# Patient Record
Sex: Female | Born: 1970 | Race: White | Hispanic: No | Marital: Married | Smoking: Never smoker
Health system: Southern US, Community
[De-identification: ages and names within clinical notes are randomized; demographics above are authoritative.]

## PROBLEM LIST (undated history)

## (undated) DIAGNOSIS — G709 Myoneural disorder, unspecified: Secondary | ICD-10-CM

## (undated) DIAGNOSIS — R112 Nausea with vomiting, unspecified: Secondary | ICD-10-CM

## (undated) DIAGNOSIS — R7303 Prediabetes: Secondary | ICD-10-CM

## (undated) DIAGNOSIS — S92902A Unspecified fracture of left foot, initial encounter for closed fracture: Secondary | ICD-10-CM

## (undated) DIAGNOSIS — F32A Depression, unspecified: Secondary | ICD-10-CM

## (undated) DIAGNOSIS — G7000 Myasthenia gravis without (acute) exacerbation: Secondary | ICD-10-CM

## (undated) DIAGNOSIS — R51 Headache: Secondary | ICD-10-CM

## (undated) DIAGNOSIS — G4730 Sleep apnea, unspecified: Secondary | ICD-10-CM

## (undated) DIAGNOSIS — I10 Essential (primary) hypertension: Secondary | ICD-10-CM

## (undated) DIAGNOSIS — J301 Allergic rhinitis due to pollen: Secondary | ICD-10-CM

## (undated) DIAGNOSIS — R002 Palpitations: Secondary | ICD-10-CM

## (undated) DIAGNOSIS — Z9889 Other specified postprocedural states: Secondary | ICD-10-CM

## (undated) DIAGNOSIS — F329 Major depressive disorder, single episode, unspecified: Secondary | ICD-10-CM

## (undated) HISTORY — PX: DIAGNOSTIC LAPAROSCOPY: SUR761

## (undated) HISTORY — DX: Myoneural disorder, unspecified: G70.9

## (undated) HISTORY — PX: LAPAROSCOPIC VAGINAL HYSTERECTOMY: SUR798

## (undated) HISTORY — PX: WISDOM TOOTH EXTRACTION: SHX21

## (undated) HISTORY — PX: COLONOSCOPY: SHX174

## (undated) HISTORY — PX: OTHER SURGICAL HISTORY: SHX169

---

## 1998-10-03 ENCOUNTER — Other Ambulatory Visit: Admission: RE | Admit: 1998-10-03 | Discharge: 1998-10-03 | Payer: Self-pay | Admitting: *Deleted

## 1999-06-01 ENCOUNTER — Encounter: Payer: Self-pay | Admitting: Family Medicine

## 1999-06-01 ENCOUNTER — Ambulatory Visit (HOSPITAL_COMMUNITY): Admission: RE | Admit: 1999-06-01 | Discharge: 1999-06-01 | Payer: Self-pay | Admitting: Family Medicine

## 2002-09-24 ENCOUNTER — Encounter: Payer: Self-pay | Admitting: Emergency Medicine

## 2002-09-24 ENCOUNTER — Emergency Department (HOSPITAL_COMMUNITY): Admission: EM | Admit: 2002-09-24 | Discharge: 2002-09-24 | Payer: Self-pay | Admitting: Emergency Medicine

## 2003-04-08 ENCOUNTER — Other Ambulatory Visit: Admission: RE | Admit: 2003-04-08 | Discharge: 2003-04-08 | Payer: Self-pay | Admitting: *Deleted

## 2004-07-13 ENCOUNTER — Other Ambulatory Visit: Admission: RE | Admit: 2004-07-13 | Discharge: 2004-07-13 | Payer: Self-pay | Admitting: Obstetrics and Gynecology

## 2005-08-22 ENCOUNTER — Other Ambulatory Visit: Admission: RE | Admit: 2005-08-22 | Discharge: 2005-08-22 | Payer: Self-pay | Admitting: Obstetrics and Gynecology

## 2007-12-30 ENCOUNTER — Encounter: Admission: RE | Admit: 2007-12-30 | Discharge: 2007-12-30 | Payer: Self-pay | Admitting: Obstetrics and Gynecology

## 2008-01-01 ENCOUNTER — Encounter: Admission: RE | Admit: 2008-01-01 | Discharge: 2008-01-01 | Payer: Self-pay | Admitting: Obstetrics and Gynecology

## 2008-06-28 ENCOUNTER — Encounter: Admission: RE | Admit: 2008-06-28 | Discharge: 2008-06-28 | Payer: Self-pay | Admitting: Obstetrics and Gynecology

## 2010-09-30 ENCOUNTER — Encounter: Payer: Self-pay | Admitting: Cardiovascular Disease

## 2010-11-22 ENCOUNTER — Other Ambulatory Visit: Payer: Self-pay | Admitting: Obstetrics and Gynecology

## 2010-11-22 DIAGNOSIS — Z1231 Encounter for screening mammogram for malignant neoplasm of breast: Secondary | ICD-10-CM

## 2010-12-01 ENCOUNTER — Ambulatory Visit
Admission: RE | Admit: 2010-12-01 | Discharge: 2010-12-01 | Disposition: A | Discharge status: Home / Self Care | Payer: BC Managed Care – PPO | Source: Ambulatory Visit | Attending: Obstetrics and Gynecology | Admitting: Obstetrics and Gynecology

## 2010-12-01 DIAGNOSIS — Z1231 Encounter for screening mammogram for malignant neoplasm of breast: Secondary | ICD-10-CM

## 2012-01-11 ENCOUNTER — Other Ambulatory Visit: Payer: Self-pay | Admitting: Obstetrics and Gynecology

## 2012-01-11 DIAGNOSIS — Z1231 Encounter for screening mammogram for malignant neoplasm of breast: Secondary | ICD-10-CM

## 2012-01-17 ENCOUNTER — Ambulatory Visit: Payer: BC Managed Care – PPO

## 2012-04-23 ENCOUNTER — Ambulatory Visit
Admission: RE | Admit: 2012-04-23 | Discharge: 2012-04-23 | Disposition: A | Discharge status: Home / Self Care | Payer: BC Managed Care – PPO | Source: Ambulatory Visit | Attending: Obstetrics and Gynecology | Admitting: Obstetrics and Gynecology

## 2012-04-23 DIAGNOSIS — Z1231 Encounter for screening mammogram for malignant neoplasm of breast: Secondary | ICD-10-CM

## 2012-06-23 ENCOUNTER — Other Ambulatory Visit (HOSPITAL_COMMUNITY): Payer: Self-pay | Admitting: Family Medicine

## 2012-06-23 ENCOUNTER — Ambulatory Visit (HOSPITAL_COMMUNITY)
Admission: RE | Admit: 2012-06-23 | Discharge: 2012-06-23 | Disposition: A | Discharge status: Home / Self Care | Payer: BC Managed Care – PPO | Source: Ambulatory Visit | Attending: Family Medicine | Admitting: Family Medicine

## 2012-06-23 DIAGNOSIS — R079 Chest pain, unspecified: Secondary | ICD-10-CM

## 2013-01-22 ENCOUNTER — Other Ambulatory Visit (HOSPITAL_COMMUNITY): Payer: Self-pay | Admitting: Obstetrics and Gynecology

## 2013-01-22 DIAGNOSIS — E041 Nontoxic single thyroid nodule: Secondary | ICD-10-CM

## 2013-01-26 ENCOUNTER — Ambulatory Visit (HOSPITAL_COMMUNITY)
Admission: RE | Admit: 2013-01-26 | Discharge: 2013-01-26 | Disposition: A | Discharge status: Home / Self Care | Payer: BC Managed Care – PPO | Source: Ambulatory Visit | Attending: Obstetrics and Gynecology | Admitting: Obstetrics and Gynecology

## 2013-01-26 DIAGNOSIS — E041 Nontoxic single thyroid nodule: Secondary | ICD-10-CM | POA: Insufficient documentation

## 2013-05-18 ENCOUNTER — Other Ambulatory Visit: Payer: Self-pay

## 2013-05-18 DIAGNOSIS — Z1231 Encounter for screening mammogram for malignant neoplasm of breast: Secondary | ICD-10-CM

## 2013-05-20 ENCOUNTER — Ambulatory Visit
Admission: RE | Admit: 2013-05-20 | Discharge: 2013-05-20 | Disposition: A | Discharge status: Home / Self Care | Payer: BC Managed Care – PPO | Source: Ambulatory Visit

## 2013-05-20 DIAGNOSIS — Z1231 Encounter for screening mammogram for malignant neoplasm of breast: Secondary | ICD-10-CM

## 2013-08-21 ENCOUNTER — Other Ambulatory Visit: Payer: Self-pay | Admitting: Family Medicine

## 2013-08-21 ENCOUNTER — Ambulatory Visit
Admission: RE | Admit: 2013-08-21 | Discharge: 2013-08-21 | Disposition: A | Discharge status: Home / Self Care | Payer: BC Managed Care – PPO | Source: Ambulatory Visit | Attending: Family Medicine | Admitting: Family Medicine

## 2013-08-21 DIAGNOSIS — M7989 Other specified soft tissue disorders: Secondary | ICD-10-CM

## 2013-08-24 ENCOUNTER — Other Ambulatory Visit: Payer: Self-pay | Admitting: Family Medicine

## 2013-08-24 DIAGNOSIS — M7989 Other specified soft tissue disorders: Secondary | ICD-10-CM

## 2013-08-27 ENCOUNTER — Ambulatory Visit
Admission: RE | Admit: 2013-08-27 | Discharge: 2013-08-27 | Disposition: A | Discharge status: Home / Self Care | Payer: BC Managed Care – PPO | Source: Ambulatory Visit | Attending: Family Medicine | Admitting: Family Medicine

## 2013-08-27 DIAGNOSIS — M7989 Other specified soft tissue disorders: Secondary | ICD-10-CM

## 2013-08-27 HISTORY — DX: Major depressive disorder, single episode, unspecified: F32.9

## 2013-08-27 HISTORY — DX: Allergic rhinitis due to pollen: J30.1

## 2013-08-27 HISTORY — DX: Essential (primary) hypertension: I10

## 2013-08-27 HISTORY — DX: Unspecified fracture of left foot, initial encounter for closed fracture: S92.902A

## 2013-08-27 HISTORY — DX: Headache: R51

## 2013-08-27 HISTORY — DX: Depression, unspecified: F32.A

## 2014-04-26 ENCOUNTER — Other Ambulatory Visit: Payer: Self-pay

## 2014-04-26 DIAGNOSIS — Z1231 Encounter for screening mammogram for malignant neoplasm of breast: Secondary | ICD-10-CM

## 2014-06-02 ENCOUNTER — Ambulatory Visit
Admission: RE | Admit: 2014-06-02 | Discharge: 2014-06-02 | Disposition: A | Discharge status: Home / Self Care | Payer: BC Managed Care – PPO | Source: Ambulatory Visit

## 2014-06-02 ENCOUNTER — Encounter (INDEPENDENT_AMBULATORY_CARE_PROVIDER_SITE_OTHER): Payer: Self-pay

## 2014-06-02 DIAGNOSIS — Z1231 Encounter for screening mammogram for malignant neoplasm of breast: Secondary | ICD-10-CM

## 2015-02-17 ENCOUNTER — Other Ambulatory Visit (HOSPITAL_COMMUNITY): Payer: Self-pay | Admitting: Obstetrics and Gynecology

## 2015-02-17 DIAGNOSIS — E041 Nontoxic single thyroid nodule: Secondary | ICD-10-CM

## 2015-02-24 ENCOUNTER — Ambulatory Visit (HOSPITAL_COMMUNITY): Payer: BLUE CROSS/BLUE SHIELD

## 2015-03-01 ENCOUNTER — Ambulatory Visit (HOSPITAL_COMMUNITY)
Admission: RE | Admit: 2015-03-01 | Discharge: 2015-03-01 | Disposition: A | Discharge status: Home / Self Care | Payer: BLUE CROSS/BLUE SHIELD | Source: Ambulatory Visit | Attending: Obstetrics and Gynecology | Admitting: Obstetrics and Gynecology

## 2015-03-01 DIAGNOSIS — E041 Nontoxic single thyroid nodule: Secondary | ICD-10-CM | POA: Insufficient documentation

## 2015-05-03 ENCOUNTER — Other Ambulatory Visit: Payer: Self-pay

## 2015-05-03 DIAGNOSIS — Z1231 Encounter for screening mammogram for malignant neoplasm of breast: Secondary | ICD-10-CM

## 2015-06-06 ENCOUNTER — Ambulatory Visit
Admission: RE | Admit: 2015-06-06 | Discharge: 2015-06-06 | Disposition: A | Discharge status: Home / Self Care | Payer: BLUE CROSS/BLUE SHIELD | Source: Ambulatory Visit

## 2015-06-06 DIAGNOSIS — Z1231 Encounter for screening mammogram for malignant neoplasm of breast: Secondary | ICD-10-CM

## 2015-10-28 ENCOUNTER — Other Ambulatory Visit: Payer: Self-pay | Admitting: Otolaryngology

## 2015-10-28 DIAGNOSIS — M542 Cervicalgia: Secondary | ICD-10-CM

## 2015-10-28 DIAGNOSIS — R221 Localized swelling, mass and lump, neck: Secondary | ICD-10-CM

## 2015-11-01 ENCOUNTER — Other Ambulatory Visit (HOSPITAL_COMMUNITY): Payer: Self-pay | Admitting: Family Medicine

## 2015-11-01 DIAGNOSIS — R011 Cardiac murmur, unspecified: Secondary | ICD-10-CM

## 2015-11-04 ENCOUNTER — Ambulatory Visit
Admission: RE | Admit: 2015-11-04 | Discharge: 2015-11-04 | Disposition: A | Discharge status: Home / Self Care | Payer: BLUE CROSS/BLUE SHIELD | Source: Ambulatory Visit | Attending: Otolaryngology | Admitting: Otolaryngology

## 2015-11-04 DIAGNOSIS — M542 Cervicalgia: Secondary | ICD-10-CM

## 2015-11-04 DIAGNOSIS — R221 Localized swelling, mass and lump, neck: Secondary | ICD-10-CM

## 2015-11-08 ENCOUNTER — Other Ambulatory Visit (HOSPITAL_COMMUNITY): Payer: BLUE CROSS/BLUE SHIELD

## 2015-11-16 ENCOUNTER — Other Ambulatory Visit: Payer: Self-pay

## 2015-11-16 ENCOUNTER — Ambulatory Visit (HOSPITAL_COMMUNITY): Payer: BLUE CROSS/BLUE SHIELD | Attending: Family Medicine

## 2015-11-16 DIAGNOSIS — I340 Nonrheumatic mitral (valve) insufficiency: Secondary | ICD-10-CM | POA: Diagnosis not present

## 2015-11-16 DIAGNOSIS — I10 Essential (primary) hypertension: Secondary | ICD-10-CM | POA: Diagnosis not present

## 2015-11-16 DIAGNOSIS — Z6837 Body mass index (BMI) 37.0-37.9, adult: Secondary | ICD-10-CM | POA: Insufficient documentation

## 2015-11-16 DIAGNOSIS — E669 Obesity, unspecified: Secondary | ICD-10-CM | POA: Insufficient documentation

## 2015-11-16 DIAGNOSIS — R011 Cardiac murmur, unspecified: Secondary | ICD-10-CM | POA: Insufficient documentation

## 2016-02-22 DIAGNOSIS — Z01419 Encounter for gynecological examination (general) (routine) without abnormal findings: Secondary | ICD-10-CM | POA: Diagnosis not present

## 2016-02-22 DIAGNOSIS — Z6838 Body mass index (BMI) 38.0-38.9, adult: Secondary | ICD-10-CM | POA: Diagnosis not present

## 2016-02-29 DIAGNOSIS — R600 Localized edema: Secondary | ICD-10-CM | POA: Diagnosis not present

## 2016-02-29 DIAGNOSIS — R21 Rash and other nonspecific skin eruption: Secondary | ICD-10-CM | POA: Diagnosis not present

## 2016-02-29 DIAGNOSIS — I10 Essential (primary) hypertension: Secondary | ICD-10-CM | POA: Diagnosis not present

## 2016-03-26 DIAGNOSIS — I10 Essential (primary) hypertension: Secondary | ICD-10-CM | POA: Diagnosis not present

## 2016-03-26 DIAGNOSIS — F419 Anxiety disorder, unspecified: Secondary | ICD-10-CM | POA: Diagnosis not present

## 2016-04-25 DIAGNOSIS — M791 Myalgia: Secondary | ICD-10-CM | POA: Diagnosis not present

## 2016-04-25 DIAGNOSIS — H02403 Unspecified ptosis of bilateral eyelids: Secondary | ICD-10-CM | POA: Diagnosis not present

## 2016-04-25 DIAGNOSIS — R5383 Other fatigue: Secondary | ICD-10-CM | POA: Diagnosis not present

## 2016-05-18 ENCOUNTER — Other Ambulatory Visit: Payer: Self-pay | Admitting: Obstetrics and Gynecology

## 2016-05-18 DIAGNOSIS — Z1231 Encounter for screening mammogram for malignant neoplasm of breast: Secondary | ICD-10-CM

## 2016-06-05 DIAGNOSIS — M2550 Pain in unspecified joint: Secondary | ICD-10-CM | POA: Diagnosis not present

## 2016-06-05 DIAGNOSIS — R5382 Chronic fatigue, unspecified: Secondary | ICD-10-CM | POA: Diagnosis not present

## 2016-06-05 DIAGNOSIS — M791 Myalgia: Secondary | ICD-10-CM | POA: Diagnosis not present

## 2016-06-07 ENCOUNTER — Ambulatory Visit: Payer: BLUE CROSS/BLUE SHIELD

## 2016-06-11 ENCOUNTER — Ambulatory Visit
Admission: RE | Admit: 2016-06-11 | Discharge: 2016-06-11 | Disposition: A | Discharge status: Home / Self Care | Payer: BLUE CROSS/BLUE SHIELD | Source: Ambulatory Visit | Attending: Obstetrics and Gynecology | Admitting: Obstetrics and Gynecology

## 2016-06-11 DIAGNOSIS — Z1231 Encounter for screening mammogram for malignant neoplasm of breast: Secondary | ICD-10-CM | POA: Diagnosis not present

## 2016-06-18 ENCOUNTER — Other Ambulatory Visit: Payer: Self-pay | Admitting: Obstetrics and Gynecology

## 2016-06-18 DIAGNOSIS — Z803 Family history of malignant neoplasm of breast: Secondary | ICD-10-CM

## 2016-07-18 ENCOUNTER — Ambulatory Visit
Admission: RE | Admit: 2016-07-18 | Discharge: 2016-07-18 | Disposition: A | Discharge status: Home / Self Care | Payer: BLUE CROSS/BLUE SHIELD | Source: Ambulatory Visit | Attending: Obstetrics and Gynecology | Admitting: Obstetrics and Gynecology

## 2016-07-18 DIAGNOSIS — Z803 Family history of malignant neoplasm of breast: Secondary | ICD-10-CM

## 2016-07-18 MED ORDER — GADOBENATE DIMEGLUMINE 529 MG/ML IV SOLN
20.0000 mL | Freq: Once | INTRAVENOUS | Status: AC | PRN
Start: 2016-07-18 — End: 2016-07-18
  Administered 2016-07-18: 20 mL via INTRAVENOUS

## 2016-07-22 ENCOUNTER — Encounter (HOSPITAL_BASED_OUTPATIENT_CLINIC_OR_DEPARTMENT_OTHER): Payer: Self-pay | Admitting: *Deleted

## 2016-07-22 ENCOUNTER — Emergency Department (HOSPITAL_BASED_OUTPATIENT_CLINIC_OR_DEPARTMENT_OTHER): Payer: BLUE CROSS/BLUE SHIELD

## 2016-07-22 ENCOUNTER — Emergency Department (HOSPITAL_BASED_OUTPATIENT_CLINIC_OR_DEPARTMENT_OTHER)
Admission: EM | Admit: 2016-07-22 | Discharge: 2016-07-22 | Disposition: A | Discharge status: Home / Self Care | Payer: BLUE CROSS/BLUE SHIELD | Attending: Emergency Medicine | Admitting: Emergency Medicine

## 2016-07-22 DIAGNOSIS — R079 Chest pain, unspecified: Secondary | ICD-10-CM | POA: Diagnosis not present

## 2016-07-22 DIAGNOSIS — I10 Essential (primary) hypertension: Secondary | ICD-10-CM | POA: Insufficient documentation

## 2016-07-22 DIAGNOSIS — M549 Dorsalgia, unspecified: Secondary | ICD-10-CM | POA: Diagnosis present

## 2016-07-22 DIAGNOSIS — Z79899 Other long term (current) drug therapy: Secondary | ICD-10-CM | POA: Insufficient documentation

## 2016-07-22 DIAGNOSIS — M546 Pain in thoracic spine: Secondary | ICD-10-CM | POA: Diagnosis not present

## 2016-07-22 LAB — BASIC METABOLIC PANEL
ANION GAP: 7 (ref 5–15)
BUN: 8 mg/dL (ref 6–20)
CHLORIDE: 103 mmol/L (ref 101–111)
CO2: 26 mmol/L (ref 22–32)
Calcium: 9.3 mg/dL (ref 8.9–10.3)
Creatinine, Ser: 0.84 mg/dL (ref 0.44–1.00)
GFR calc Af Amer: >60 mL/min (ref >60)
GLUCOSE: 123 mg/dL — AB (ref 65–99)
POTASSIUM: 3.7 mmol/L (ref 3.5–5.1)
Sodium: 136 mmol/L (ref 135–145)

## 2016-07-22 LAB — CBC
HEMATOCRIT: 36.6 % (ref 36.0–46.0)
HEMOGLOBIN: 11.8 g/dL — AB (ref 12.0–15.0)
MCH: 24.9 pg — AB (ref 26.0–34.0)
MCHC: 32.2 g/dL (ref 30.0–36.0)
MCV: 77.4 fL — AB (ref 78.0–100.0)
Platelets: 406 10*3/uL — ABNORMAL HIGH (ref 150–400)
RBC: 4.73 MIL/uL (ref 3.87–5.11)
RDW: 13.9 % (ref 11.5–15.5)
WBC: 8.8 10*3/uL (ref 4.0–10.5)

## 2016-07-22 LAB — TROPONIN I

## 2016-07-22 MED ORDER — HYDROCODONE-IBUPROFEN 5-200 MG PO TABS: 1.0000 | ORAL_TABLET | Freq: Three times a day (TID) | ORAL | 0 refills | Status: DC | PRN

## 2016-07-22 MED ORDER — KETOROLAC TROMETHAMINE 30 MG/ML IJ SOLN
30.0000 mg | Freq: Once | INTRAMUSCULAR | Status: AC
  Administered 2016-07-22: 30 mg via INTRAVENOUS
  Filled 2016-07-22: qty 1

## 2016-07-22 NOTE — ED Triage Notes (Addendum)
Intermittent left upper back pain radiating down left arm through the night and worse this morning.  Pt very emotional in triage due to pain.  Reports nausea, denies vomiting.  Pt was woken up frequently last night due to pain.  Non tender on palpation of back or arm.

## 2016-07-22 NOTE — ED Notes (Signed)
Pt teaching provided on medications that may cause drowsiness. Pt instructed not to drive or operate heavy machinery while taking the prescribed medication. Pt verbalized understanding.   

## 2016-07-22 NOTE — ED Provider Notes (Signed)
[facility] Provider Note   CSN: [id number] Arrival date & time: 07/22/16  1342     History   Chief Complaint Chief Complaint  Patient presents with  . Back Pain    HPI Rachael Hicks is a 45 y.o. female.  HPI   45 year old female presents today with complaints of back pain. Patient reports that 2 days ago she was lifting her left arm over her head when she felt a very minor pain in her left posterior shoulder and back. She notes that yesterday she started developing more severe pain in the back that radiated down into the elbow. Today she notes severe pain in the shoulder, back home a and elbow. She denies any specific movements that aggravate her symptoms, but notes that just generalized movement causes pain. She denies any numbness or tingling down into lower extremity, denies any decreased range of motion or strength. Patient denies any infectious etiology. She does report that she had recently got over an upper respiratory infection, she denies any chest pain, shortness of breath, dizziness, lightheadedness, abdominal pain. She reports the pain feels like a deep dull ache almost like a toothache in her elbow. She denies any history of the same. Patient reports taking an 100 mg of ibuprofen around 10:30 this morning with only minor improvement in her symptoms. She denies any lotion swelling or edema, history of DVT or PE, or any other significant risk factors. She denies any cardiac history, denies any family cardiac history, reports she has hypertensive, no other ACS risk factors.   Past Medical History:  Diagnosis Date  . Depression   . Fracture of left foot    age 28.    Marland Kitchen Hay fever   . Headache(784.0)   . Hypertension     There are no active problems to display for this patient.   Past Surgical History:  Procedure Laterality Date  . WISDOM TOOTH EXTRACTION     age 59    OB History    No data available       Home Medications    Prior to  Admission medications   Medication Sig Start Date End Date Taking? Authorizing Provider  carvedilol (COREG) 6.25 MG tablet Take 6.25 mg by mouth 2 (two) times daily with a meal.   Yes Historical Provider, MD  pyridostigmine (MESTINON) 60 MG tablet Take 60 mg by mouth 3 (three) times daily.   Yes Historical Provider, MD  amLODipine (NORVASC) 5 MG tablet Take 5 mg by mouth daily.    Historical Provider, MD  calcium carbonate (OS-CAL) 600 MG TABS tablet Take 600 mg by mouth 2 (two) times daily with a meal.    Historical Provider, MD  FLUoxetine (PROZAC) 20 MG capsule Take 60 mg by mouth daily.    Historical Provider, MD  hydrocodone-ibuprofen (VICOPROFEN) 5-200 MG tablet Take 1 tablet by mouth every 8 (eight) hours as needed for pain. 07/22/16   Okey Regal, PA-C  losartan-hydrochlorothiazide (HYZAAR) 100-25 MG per tablet Take 1 tablet by mouth daily.    Historical Provider, MD  Vitamin D, Ergocalciferol, (DRISDOL) 50000 UNITS CAPS capsule Take 50,000 Units by mouth every 7 (seven) days.    Historical Provider, MD    Family History History reviewed. No pertinent family history.  Social History Social History  Substance Use Topics  . Smoking status: Never Smoker  . Smokeless tobacco: Never Used  . Alcohol use No     Allergies   Patient has no known allergies.  Review of Systems Review of Systems  All other systems reviewed and are negative.    Physical Exam Updated Vital Signs BP 172/92   Pulse 69   Temp 98.5 F (36.9 C) (Oral)   Resp 14   Ht 5\' 7"  (1.702 m)   Wt 111.1 kg   LMP 07/01/2016   SpO2 100%   BMI 38.37 kg/m   Physical Exam  Constitutional: She is oriented to person, place, and time. She appears well-developed and well-nourished.  HENT:  Head: Normocephalic and atraumatic.  Eyes: Conjunctivae are normal. Pupils are equal, round, and reactive to light. Right eye exhibits no discharge. Left eye exhibits no discharge. No scleral icterus.  Neck: Normal range  of motion. No JVD present. No tracheal deviation present.  Cardiovascular: Normal rate, regular rhythm, normal heart sounds and intact distal pulses.  Exam reveals no gallop and no friction rub.   No murmur heard. Pulmonary/Chest: Effort normal and breath sounds normal. No stridor. No respiratory distress. She has no wheezes. She has no rales. She exhibits no tenderness.  Abdominal: She exhibits no distension and no mass. There is no tenderness. There is no rebound and no guarding. No hernia.  Abdomen is soft and nontender  Musculoskeletal:  No CT or L-spine tenderness. No tenderness to the remainder of the back with light palpation. Patient has left-sided thoracic and lower cervical lateral soft tissue tenderness with deep palpation. Palpating the thoracic region causes pain into the left elbow. Upper extremity strength is 5 out of 5, sensation grossly intact, full active range of motion. Radial pulse 2+ and equal bilateral. No pain with axial loading of the cervical spine  Neurological: She is alert and oriented to person, place, and time. Coordination normal.  Psychiatric: She has a normal mood and affect. Her behavior is normal. Judgment and thought content normal.  Nursing note and vitals reviewed.    ED Treatments / Results  Labs (all labs ordered are listed, but only abnormal results are displayed) Labs Reviewed  BASIC METABOLIC PANEL - Abnormal; Notable for the following:       Result Value   Glucose, Bld 123 (*)    All other components within normal limits  CBC - Abnormal; Notable for the following:    Hemoglobin 11.8 (*)    MCV 77.4 (*)    MCH 24.9 (*)    Platelets 406 (*)    All other components within normal limits  TROPONIN I    EKG  EKG Interpretation  Date/Time:  Sunday July 22 2016 13:53:42 EST Ventricular Rate:  80 PR Interval:  132 QRS Duration: 86 QT Interval:  370 QTC Calculation: 426 R Axis:   56 Text Interpretation:  Normal sinus rhythm Nonspecific  ST abnormality Abnormal ECG No previous ECGs available Confirmed by LITTLE MD, RACHEL [phone]) on 07/22/2016 2:05:06 PM       Radiology Dg Chest 2 View  Result Date: 07/22/2016 CLINICAL DATA:  Patient with left upper back pain radiating into the chest. EXAM: CHEST  2 VIEW COMPARISON:  Chest radiograph 06/23/2012. FINDINGS: Monitoring leads overlie the patient. Normal cardiac and mediastinal contours. No consolidative pulmonary opacities. No pleural effusion or pneumothorax. Regional skeleton is unremarkable. IMPRESSION: No active cardiopulmonary disease. Electronically Signed   By: Lovey Newcomer M.D.   On: 07/22/2016 14:35    Procedures Procedures (including critical care time)  Medications Ordered in ED Medications  ketorolac (TORADOL) 30 MG/ML injection 30 mg (30 mg Intravenous Given 07/22/16 1512)  Initial Impression / Assessment and Plan / ED Course  I have reviewed the triage vital signs and the nursing notes.  Pertinent labs & imaging results that were available during my care of the patient were reviewed by me and considered in my medical decision making (see chart for details).  Clinical Course      Final Clinical Impressions(s) / ED Diagnoses   Final diagnoses:  Acute left-sided thoracic back pain    Labs: BMP, CBC, troponin  Imaging: DG chest 2 view  Consults:  Therapeutics: Toradol  Discharge Meds:   Assessment/Plan:  45 year old female presents today with back pain. Patient originally had severe back pain, with radiation into her shoulder and elbow. She denied any chest pain shortness of breath, dizziness or lightheadedness. Due to the patient's severity of pain and radiation into the shoulder, initial chest x-ray, troponin, EKG were ordered. All within normal limits. Patient's symptoms started 2 days ago worsening last night, she has no signs or symptoms that would be consistent with ACS, dissection, or pulmonary etiology. Patient has no abdominal pain.  This is likely musculoskeletal back pain with radiation into the left elbow. Patient reports that she feels like this is her back, and is not any other etiology. She was given Toradol here which significantly improved her symptoms, she will be discharged home with short course of pain medication, close follow-up with primary care provider. She is instructed to return immediately to the emergency room if any new or worsening signs or symptoms present. Both the patient and her husband verbalized understanding and agreement to today's plan and had no further questions or concerns at time discharge  New Prescriptions Discharge Medication List as of 07/22/2016  4:13 PM    START taking these medications   Details  hydrocodone-ibuprofen (VICOPROFEN) 5-200 MG tablet Take 1 tablet by mouth every 8 (eight) hours as needed for pain., Starting Sun 07/22/2016, Print         Okey Regal, PA-C 07/22/16 [place], MD 07/25/16 [phone]

## 2016-07-22 NOTE — Discharge Instructions (Signed)
Please read attached information. If you experience any new or worsening signs or symptoms please return to the emergency room for evaluation. Please follow-up with your primary care provider or specialist as discussed. Please use medication prescribed only as directed and discontinue taking if you have any concerning signs or symptoms.   °

## 2016-08-15 DIAGNOSIS — M5442 Lumbago with sciatica, left side: Secondary | ICD-10-CM | POA: Diagnosis not present

## 2016-08-15 DIAGNOSIS — M5441 Lumbago with sciatica, right side: Secondary | ICD-10-CM | POA: Diagnosis not present

## 2016-08-15 DIAGNOSIS — G8929 Other chronic pain: Secondary | ICD-10-CM | POA: Diagnosis not present

## 2016-08-15 DIAGNOSIS — M542 Cervicalgia: Secondary | ICD-10-CM | POA: Diagnosis not present

## 2016-09-04 DIAGNOSIS — J208 Acute bronchitis due to other specified organisms: Secondary | ICD-10-CM | POA: Diagnosis not present

## 2016-09-12 DIAGNOSIS — J208 Acute bronchitis due to other specified organisms: Secondary | ICD-10-CM | POA: Diagnosis not present

## 2016-10-03 DIAGNOSIS — M25572 Pain in left ankle and joints of left foot: Secondary | ICD-10-CM | POA: Diagnosis not present

## 2016-12-11 DIAGNOSIS — R0683 Snoring: Secondary | ICD-10-CM | POA: Diagnosis not present

## 2017-01-16 DIAGNOSIS — G4733 Obstructive sleep apnea (adult) (pediatric): Secondary | ICD-10-CM | POA: Diagnosis not present

## 2017-02-05 DIAGNOSIS — G4733 Obstructive sleep apnea (adult) (pediatric): Secondary | ICD-10-CM | POA: Diagnosis not present

## 2017-03-08 DIAGNOSIS — G4733 Obstructive sleep apnea (adult) (pediatric): Secondary | ICD-10-CM | POA: Diagnosis not present

## 2017-03-11 DIAGNOSIS — Z01419 Encounter for gynecological examination (general) (routine) without abnormal findings: Secondary | ICD-10-CM | POA: Diagnosis not present

## 2017-03-11 DIAGNOSIS — Z6841 Body Mass Index (BMI) 40.0 and over, adult: Secondary | ICD-10-CM | POA: Diagnosis not present

## 2017-03-14 ENCOUNTER — Other Ambulatory Visit: Payer: Self-pay | Admitting: Obstetrics and Gynecology

## 2017-03-14 DIAGNOSIS — Z1509 Genetic susceptibility to other malignant neoplasm: Principal | ICD-10-CM

## 2017-03-14 DIAGNOSIS — Z1501 Genetic susceptibility to malignant neoplasm of breast: Secondary | ICD-10-CM

## 2017-04-07 DIAGNOSIS — G4733 Obstructive sleep apnea (adult) (pediatric): Secondary | ICD-10-CM | POA: Diagnosis not present

## 2017-04-15 DIAGNOSIS — Z6841 Body Mass Index (BMI) 40.0 and over, adult: Secondary | ICD-10-CM | POA: Diagnosis not present

## 2017-04-15 DIAGNOSIS — D509 Iron deficiency anemia, unspecified: Secondary | ICD-10-CM | POA: Diagnosis not present

## 2017-04-15 DIAGNOSIS — E559 Vitamin D deficiency, unspecified: Secondary | ICD-10-CM | POA: Diagnosis not present

## 2017-04-15 DIAGNOSIS — I10 Essential (primary) hypertension: Secondary | ICD-10-CM | POA: Diagnosis not present

## 2017-04-16 DIAGNOSIS — G4733 Obstructive sleep apnea (adult) (pediatric): Secondary | ICD-10-CM | POA: Diagnosis not present

## 2017-05-01 DIAGNOSIS — G4733 Obstructive sleep apnea (adult) (pediatric): Secondary | ICD-10-CM | POA: Diagnosis not present

## 2017-05-07 DIAGNOSIS — G4733 Obstructive sleep apnea (adult) (pediatric): Secondary | ICD-10-CM | POA: Diagnosis not present

## 2017-05-08 DIAGNOSIS — G4733 Obstructive sleep apnea (adult) (pediatric): Secondary | ICD-10-CM | POA: Diagnosis not present

## 2017-05-14 ENCOUNTER — Encounter: Payer: Self-pay | Admitting: Neurology

## 2017-05-24 ENCOUNTER — Ambulatory Visit: Payer: BLUE CROSS/BLUE SHIELD | Admitting: Neurology

## 2017-05-30 ENCOUNTER — Ambulatory Visit (INDEPENDENT_AMBULATORY_CARE_PROVIDER_SITE_OTHER): Payer: BLUE CROSS/BLUE SHIELD | Admitting: Neurology

## 2017-05-30 ENCOUNTER — Encounter: Payer: Self-pay | Admitting: Neurology

## 2017-05-30 VITALS — BP 110/70 | HR 70 | Ht 67.0 in | Wt 259.3 lb

## 2017-05-30 DIAGNOSIS — H02402 Unspecified ptosis of left eyelid: Secondary | ICD-10-CM

## 2017-05-30 MED ORDER — PYRIDOSTIGMINE BROMIDE 60 MG PO TABS: 60.0000 mg | ORAL_TABLET | Freq: Three times a day (TID) | ORAL | 11 refills | Status: AC

## 2017-05-30 NOTE — Progress Notes (Signed)
[facility] Clinic Note - Initial Visit   Date: 05/30/17  Rachael Hicks MRN: [id number] DOB: 1971-09-07   Dear Dr. Alyson Ingles:  Thank you for your kind referral of Rachael Hicks for consultation of ptosis. Although her history is well known to you, please allow [place] to reiterate it for the purpose of our medical record. The patient was accompanied to the clinic by self.   History of Present Illness: Rachael Hicks is a 46 y.o. right-handed Caucasian female with hypertension and GERD presenting for evaluation of left ptosis.      Symptoms started in 2015 with generalized fatigue and heaviness of her arms and legs.  She saw her PCP, Dr. Dema Severin, who noted left ptosis and referred her to neurology for evaluation for myasthenia gravis. She established care with Dr. Latanya Maudlin at [facility] since 2015 - 2017 whose notes have been reviewed.  She had extensive testing including AChR and MUSK antibody testing, NCS/EMG with RNS, and single finger EMG at [facility] which returned normal.  She was offered a trial of mestinon 60mg  three times daily and noticed marked improvement, so has been continued on this.  She usually takes 1 tablet 2-3 times daily, and when symptoms are severe, she takes an extra dose.  Symptoms are mostly left ptosis and fatigue, sometimes she has blurry vision, difficulty with speech and shortness of breath.  She had not been hospitalized in the past.  Despite her work-up being negative for myasthenia, she was continued on mestinon 60mg  TID and has been doing well.  She had no history of autoimmune disease.  Her father died of ALS at the age of 49.    Past Medical History:  Diagnosis Date  . Depression   . Fracture of left foot    age 28.    Marland Kitchen Hay fever   . Headache(784.0)   . Hypertension     Past Surgical History:  Procedure Laterality Date  . WISDOM TOOTH EXTRACTION     age 72     Medications:  Outpatient  Encounter Prescriptions as of 05/30/2017  Medication Sig  . amLODipine (NORVASC) 5 MG tablet Take 5 mg by mouth daily.  . carvedilol (COREG) 6.25 MG tablet Take 12.5 mg by mouth 2 (two) times daily with a meal.   . cetirizine (ZYRTEC) 10 MG tablet Take 10 mg by mouth daily.  . Cholecalciferol (VITAMIN D3) 10000 units TABS Take by mouth.  . ferrous sulfate (FEOSOL) 325 (65 FE) MG tablet Take 325 mg by mouth daily with breakfast.  . FLUoxetine HCl 60 MG TABS Take 60 mg by mouth.  . olmesartan-hydrochlorothiazide (BENICAR HCT) 40-25 MG tablet Take by mouth.  . pyridostigmine (MESTINON) 60 MG tablet Take 60 mg by mouth 3 (three) times daily.  . ranitidine (ZANTAC) 150 MG tablet Take by mouth.  . [DISCONTINUED] calcium carbonate (OS-CAL) 600 MG TABS tablet Take 600 mg by mouth 2 (two) times daily with a meal.  . [DISCONTINUED] FLUoxetine (PROZAC) 20 MG capsule Take 60 mg by mouth daily.  . [DISCONTINUED] hydrocodone-ibuprofen (VICOPROFEN) 5-200 MG tablet Take 1 tablet by mouth every 8 (eight) hours as needed for pain.  . [DISCONTINUED] losartan-hydrochlorothiazide (HYZAAR) 100-25 MG per tablet Take 1 tablet by mouth daily.  . [DISCONTINUED] Vitamin D, Ergocalciferol, (DRISDOL) 50000 UNITS CAPS capsule Take 50,000 Units by mouth every 7 (seven) days.   No facility-administered encounter medications on file as of 05/30/2017.      Allergies:  Allergies  Allergen Reactions  . Lisinopril Other (See Comments)  . Valsartan Other (See Comments)    pvc    Family History: No family history on file.  Social History: Social History  Substance Use Topics  . Smoking status: Never Smoker  . Smokeless tobacco: Never Used  . Alcohol use No   Social History   Social History Narrative  . No narrative on file    Review of Systems:  CONSTITUTIONAL: No fevers, chills, night sweats, or weight loss.   EYES: No visual changes or eye pain ENT: No hearing changes.  No history of nose bleeds.     RESPIRATORY: No cough, wheezing and shortness of breath.   CARDIOVASCULAR: Negative for chest pain, and palpitations.   GI: Negative for abdominal discomfort, blood in stools or black stools.  No recent change in bowel habits.   GU:  No history of incontinence.   MUSCLOSKELETAL: No history of joint pain or swelling.  No myalgias.   SKIN: Negative for lesions, rash, and itching.   HEMATOLOGY/ONCOLOGY: Negative for prolonged bleeding, bruising easily, and swollen nodes.  No history of cancer.   ENDOCRINE: Negative for cold or heat intolerance, polydipsia or goiter.   PSYCH:  No depression or anxiety symptoms.   NEURO: As Above.   Vital Signs:  BP 110/70   Pulse 70   Ht 5\' 7"  (1.702 m)   Wt 259 lb 5 oz (117.6 kg)   SpO2 98%   BMI 40.61 kg/m    General Medical Exam:   General:  Well appearing, comfortable.   Eyes/ENT: see cranial nerve examination.   Neck: No masses appreciated.  Full range of motion without tenderness.  No carotid bruits. Respiratory:  Clear to auscultation, good air entry bilaterally.   Cardiac:  Regular rate and rhythm, no murmur.   Extremities:  No deformities, edema, or skin discoloration.  Skin:  No rashes or lesions.  Neurological Exam: MENTAL STATUS including orientation to time, place, person, recent and remote memory, attention span and concentration, language, and fund of knowledge is normal.  Speech is not dysarthric.  CRANIAL NERVES: II:  No visual field defects.  Unremarkable fundi.   III-IV-VI: Pupils equal round and reactive to light.  Normal conjugate, extra-ocular eye movements in all directions of gaze.  No nystagmus.  Left ptosis at baseline and worsening with sustained upgaze.   V:  Normal facial sensation.     VII:  Normal facial symmetry and movements.  Facial muscles are 5/5 (frontalis, oribicularis oculi, buccinator).  VIII:  Normal hearing and vestibular function.   IX-X:  Normal palatal movement.   XI:  Normal shoulder shrug and head  rotation.   XII:  Normal tongue strength and range of motion, no deviation or fasciculation.  MOTOR:  Motor strength is 5/5 throughout, no fatigability. No atrophy, fasciculations or abnormal movements.  No pronator drift.  Tone is normal.    MSRs:   Reflexes are 2+/4 throughout. Plantars are down going.  SENSORY:  Normal and symmetric perception of light touch, pinprick, vibration, and proprioception.   COORDINATION/GAIT: Normal finger-to- nose-finger.  Intact rapid alternating movements bilaterally.  Gait narrow based and stable. Tandem and stressed gait intact.    IMPRESSION: Mrs. Deas is a 46 year-old female referred for evaluation of left ptosis.  She had extensive evaluation for neuromuscular junction disorder including NCS/EMG with RNS, single fiber EMG, and serology testing for AChR antibody and MUSK which has been normal. She was offered mestinon 60mg  and  noticed marked improvement in symptoms, so has been continued on this given safety profile.  I offered to repeat testing, or at the least, check for newer antibodies that are available such as anti-L4P4 antibody, if this is not cost prohibitive.  She was provided with contact information for [facility] and if this lab testing is affordable to patient, we will go and ahead and check her for this.  In the meantime, she will be continued on mestinon 60mg  2-3 times daily as needed for symptoms.  If her develops worsening weakness in the future, I discussed that repeat NCS/EMG with RNS and serology testing will be needed, as to better understand the nature of her disease, especially if stronger immunomodulatory therapies were considered as part of her management.  She expressed understanding.   Return to clinic in 1 year or sooner as needed  The duration of this appointment visit was 45 minutes of face-to-face time with the patient.  Greater than 50% of this time was spent in counseling, explanation of diagnosis, planning of further  management, and coordination of care.   Thank you for allowing me to participate in patient's care.  If I can answer any additional questions, I would be pleased to do so.    Sincerely,    Donika K. Posey Pronto, DO

## 2017-05-30 NOTE — Patient Instructions (Addendum)
Please call [facility] at [phone] and ask for lab costs for Test Code 2023 (anti-L4P4 antibody)   Return to clinic in 1 year

## 2017-06-06 DIAGNOSIS — J069 Acute upper respiratory infection, unspecified: Secondary | ICD-10-CM | POA: Diagnosis not present

## 2017-06-08 DIAGNOSIS — G4733 Obstructive sleep apnea (adult) (pediatric): Secondary | ICD-10-CM | POA: Diagnosis not present

## 2017-06-25 ENCOUNTER — Other Ambulatory Visit: Payer: Self-pay | Admitting: Obstetrics and Gynecology

## 2017-06-25 DIAGNOSIS — Z1231 Encounter for screening mammogram for malignant neoplasm of breast: Secondary | ICD-10-CM

## 2017-07-05 DIAGNOSIS — Z780 Asymptomatic menopausal state: Secondary | ICD-10-CM | POA: Diagnosis not present

## 2017-07-08 DIAGNOSIS — G4733 Obstructive sleep apnea (adult) (pediatric): Secondary | ICD-10-CM | POA: Diagnosis not present

## 2017-07-12 ENCOUNTER — Ambulatory Visit
Admission: RE | Admit: 2017-07-12 | Discharge: 2017-07-12 | Disposition: A | Discharge status: Home / Self Care | Payer: BLUE CROSS/BLUE SHIELD | Source: Ambulatory Visit | Attending: Obstetrics and Gynecology | Admitting: Obstetrics and Gynecology

## 2017-07-12 DIAGNOSIS — Z1231 Encounter for screening mammogram for malignant neoplasm of breast: Secondary | ICD-10-CM

## 2017-07-16 ENCOUNTER — Other Ambulatory Visit: Payer: BLUE CROSS/BLUE SHIELD

## 2017-07-18 ENCOUNTER — Other Ambulatory Visit: Payer: BLUE CROSS/BLUE SHIELD

## 2017-07-19 DIAGNOSIS — D509 Iron deficiency anemia, unspecified: Secondary | ICD-10-CM | POA: Diagnosis not present

## 2017-07-22 ENCOUNTER — Ambulatory Visit
Admission: RE | Admit: 2017-07-22 | Discharge: 2017-07-22 | Disposition: A | Discharge status: Home / Self Care | Payer: BLUE CROSS/BLUE SHIELD | Source: Ambulatory Visit | Attending: Obstetrics and Gynecology | Admitting: Obstetrics and Gynecology

## 2017-07-22 DIAGNOSIS — Z1509 Genetic susceptibility to other malignant neoplasm: Principal | ICD-10-CM

## 2017-07-22 DIAGNOSIS — Z1501 Genetic susceptibility to malignant neoplasm of breast: Secondary | ICD-10-CM

## 2017-07-22 MED ORDER — GADOBENATE DIMEGLUMINE 529 MG/ML IV SOLN
20.0000 mL | Freq: Once | INTRAVENOUS | Status: AC | PRN
  Administered 2017-07-22: 20 mL via INTRAVENOUS

## 2017-08-08 DIAGNOSIS — G4733 Obstructive sleep apnea (adult) (pediatric): Secondary | ICD-10-CM | POA: Diagnosis not present

## 2017-09-18 DIAGNOSIS — D225 Melanocytic nevi of trunk: Secondary | ICD-10-CM | POA: Diagnosis not present

## 2017-09-18 DIAGNOSIS — D485 Neoplasm of uncertain behavior of skin: Secondary | ICD-10-CM | POA: Diagnosis not present

## 2017-09-18 DIAGNOSIS — Z86018 Personal history of other benign neoplasm: Secondary | ICD-10-CM | POA: Diagnosis not present

## 2017-09-18 DIAGNOSIS — L719 Rosacea, unspecified: Secondary | ICD-10-CM | POA: Diagnosis not present

## 2017-10-16 DIAGNOSIS — F419 Anxiety disorder, unspecified: Secondary | ICD-10-CM | POA: Diagnosis not present

## 2017-10-16 DIAGNOSIS — I10 Essential (primary) hypertension: Secondary | ICD-10-CM | POA: Diagnosis not present

## 2017-10-16 DIAGNOSIS — D509 Iron deficiency anemia, unspecified: Secondary | ICD-10-CM | POA: Diagnosis not present

## 2017-10-16 DIAGNOSIS — Z13220 Encounter for screening for lipoid disorders: Secondary | ICD-10-CM | POA: Diagnosis not present

## 2017-10-16 DIAGNOSIS — E559 Vitamin D deficiency, unspecified: Secondary | ICD-10-CM | POA: Diagnosis not present

## 2017-10-17 DIAGNOSIS — R7309 Other abnormal glucose: Secondary | ICD-10-CM | POA: Diagnosis not present

## 2017-10-31 DIAGNOSIS — G4733 Obstructive sleep apnea (adult) (pediatric): Secondary | ICD-10-CM | POA: Diagnosis not present

## 2018-01-22 DIAGNOSIS — R7303 Prediabetes: Secondary | ICD-10-CM | POA: Diagnosis not present

## 2018-03-12 DIAGNOSIS — Z803 Family history of malignant neoplasm of breast: Secondary | ICD-10-CM | POA: Diagnosis not present

## 2018-03-12 DIAGNOSIS — Z6841 Body Mass Index (BMI) 40.0 and over, adult: Secondary | ICD-10-CM | POA: Diagnosis not present

## 2018-03-12 DIAGNOSIS — Z8049 Family history of malignant neoplasm of other genital organs: Secondary | ICD-10-CM | POA: Diagnosis not present

## 2018-03-12 DIAGNOSIS — Z800 Family history of malignant neoplasm of digestive organs: Secondary | ICD-10-CM | POA: Diagnosis not present

## 2018-03-12 DIAGNOSIS — Z01419 Encounter for gynecological examination (general) (routine) without abnormal findings: Secondary | ICD-10-CM | POA: Diagnosis not present

## 2018-03-14 ENCOUNTER — Other Ambulatory Visit (HOSPITAL_COMMUNITY): Payer: Self-pay | Admitting: Obstetrics and Gynecology

## 2018-03-14 DIAGNOSIS — E041 Nontoxic single thyroid nodule: Secondary | ICD-10-CM

## 2018-03-18 ENCOUNTER — Ambulatory Visit (HOSPITAL_COMMUNITY)
Admission: RE | Admit: 2018-03-18 | Discharge: 2018-03-18 | Disposition: A | Discharge status: Home / Self Care | Payer: BLUE CROSS/BLUE SHIELD | Source: Ambulatory Visit | Attending: Obstetrics and Gynecology | Admitting: Obstetrics and Gynecology

## 2018-03-18 DIAGNOSIS — E041 Nontoxic single thyroid nodule: Secondary | ICD-10-CM | POA: Diagnosis not present

## 2018-04-17 DIAGNOSIS — G4733 Obstructive sleep apnea (adult) (pediatric): Secondary | ICD-10-CM | POA: Diagnosis not present

## 2018-04-29 DIAGNOSIS — Z809 Family history of malignant neoplasm, unspecified: Secondary | ICD-10-CM | POA: Diagnosis not present

## 2018-05-02 ENCOUNTER — Telehealth: Payer: Self-pay | Admitting: Hematology

## 2018-05-02 ENCOUNTER — Encounter: Payer: Self-pay | Admitting: Hematology

## 2018-05-02 NOTE — Telephone Encounter (Signed)
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 Pt has been scheduled to see Dr. Feng on 9/13 at 230pm. Pt aware to arrive 15 minutes early. Letter mailed. °

## 2018-05-08 DIAGNOSIS — R29898 Other symptoms and signs involving the musculoskeletal system: Secondary | ICD-10-CM | POA: Diagnosis not present

## 2018-05-08 DIAGNOSIS — H02402 Unspecified ptosis of left eyelid: Secondary | ICD-10-CM | POA: Diagnosis not present

## 2018-05-19 DIAGNOSIS — N920 Excessive and frequent menstruation with regular cycle: Secondary | ICD-10-CM | POA: Diagnosis not present

## 2018-05-19 DIAGNOSIS — Z30432 Encounter for removal of intrauterine contraceptive device: Secondary | ICD-10-CM | POA: Diagnosis not present

## 2018-05-19 DIAGNOSIS — N946 Dysmenorrhea, unspecified: Secondary | ICD-10-CM | POA: Diagnosis not present

## 2018-05-19 DIAGNOSIS — Z1504 Genetic susceptibility to malignant neoplasm of endometrium: Secondary | ICD-10-CM | POA: Diagnosis not present

## 2018-05-19 DIAGNOSIS — Z1509 Genetic susceptibility to other malignant neoplasm: Secondary | ICD-10-CM | POA: Diagnosis not present

## 2018-05-23 ENCOUNTER — Inpatient Hospital Stay: Payer: BLUE CROSS/BLUE SHIELD | Attending: Hematology | Admitting: Hematology

## 2018-05-23 ENCOUNTER — Inpatient Hospital Stay: Payer: BLUE CROSS/BLUE SHIELD | Admitting: Hematology

## 2018-05-23 ENCOUNTER — Encounter: Payer: Self-pay | Admitting: Hematology

## 2018-05-23 ENCOUNTER — Telehealth: Payer: Self-pay

## 2018-05-23 DIAGNOSIS — Z803 Family history of malignant neoplasm of breast: Secondary | ICD-10-CM | POA: Diagnosis not present

## 2018-05-23 DIAGNOSIS — Z1502 Genetic susceptibility to malignant neoplasm of ovary: Secondary | ICD-10-CM

## 2018-05-23 DIAGNOSIS — Z1504 Genetic susceptibility to malignant neoplasm of endometrium: Secondary | ICD-10-CM | POA: Diagnosis not present

## 2018-05-23 DIAGNOSIS — Z79899 Other long term (current) drug therapy: Secondary | ICD-10-CM | POA: Insufficient documentation

## 2018-05-23 DIAGNOSIS — I10 Essential (primary) hypertension: Secondary | ICD-10-CM | POA: Insufficient documentation

## 2018-05-23 DIAGNOSIS — Z1509 Genetic susceptibility to other malignant neoplasm: Secondary | ICD-10-CM | POA: Diagnosis not present

## 2018-05-23 DIAGNOSIS — Z1501 Genetic susceptibility to malignant neoplasm of breast: Secondary | ICD-10-CM | POA: Insufficient documentation

## 2018-05-23 DIAGNOSIS — Z800 Family history of malignant neoplasm of digestive organs: Secondary | ICD-10-CM | POA: Diagnosis not present

## 2018-05-23 NOTE — Progress Notes (Signed)
[place]  [phone] [phone]  Clinic New Consult Note   Patient Care Team: Maury Dus, MD as PCP - General (Family Medicine) 05/22/2018  Referring Physician: Dr. Radene Knee  CHIEF COMPLAINTS/PURPOSE OF CONSULTATION:  Positive PMS2 mutation  HISTORY OF PRESENTING ILLNESS:  Rachael Hicks 47 y.o. female is here because of positive PMS2 mutation. Genetic testing was recommended by Dr. Radene Knee due to maternal aunt and first cousin with breast cancer. Her cousin had breast cancer at age 33. Maternal grandfather had GI cancer at age 63. Her maternal grandmother had GU cancer and a hysterectomy age 101. She will have a  vaginal hysterectomy and BSO on 06/16/2018 with Dr. Radene Knee.  Today, she is here alone at the clinic. She denies personal history of cancer.   Past medical history of MG and HTN. She is not currently on iron pills as her Hg stabilized.   Menarche at age 80. First pregnancy at age 75. 2 pregnancies. 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   MEDICAL HISTORY:  Past Medical History:  Diagnosis Date  . Depression   . Fracture of left foot    age 67.    Marland Kitchen Hay fever   . Headache(784.0)   . Hypertension     SURGICAL HISTORY: Past Surgical History:  Procedure Laterality Date  . WISDOM TOOTH EXTRACTION     age 23    SOCIAL HISTORY: Social History   Socioeconomic History  . Marital status: Married    Spouse name: Not on file  . Number of children: 2  . Years of education: 37  . Highest education level: Not on file  Occupational History  . Occupation: Therapist, sports  Social Needs  . Financial resource strain: Not on file  . Food insecurity:    Worry: Not on file    Inability: Not on file  . Transportation needs:    Medical: Not on file    Non-medical: Not on file  Tobacco Use  . Smoking status: Never Smoker  . Smokeless tobacco: Never Used  Substance and Sexual Activity  . Alcohol use: No  . Drug use: No  . Sexual  activity: Yes    Partners: Male  Lifestyle  . Physical activity:    Days per week: Not on file    Minutes per session: Not on file  . Stress: Not on file  Relationships  . Social connections:    Talks on phone: Not on file    Gets together: Not on file    Attends religious service: Not on file    Active member of club or organization: Not on file    Attends meetings of clubs or organizations: Not on file    Relationship status: Not on file  . Intimate partner violence:    Fear of current or ex partner: Not on file    Emotionally abused: Not on file    Physically abused: Not on file    Forced sexual activity: Not on file  Other Topics Concern  . Not on file  Social History Narrative   Lives with husband in a 2 story home.  Has 2 daughters.     Works for [employer] as Therapist, sports.     Education: college.     FAMILY HISTORY: Family History  Problem Relation Age of Onset  . Hypertension Mother   . Hyperlipidemia Mother   . Diabetes Mellitus II Mother   . ALS Father 16    ALLERGIES:  is  allergic to lisinopril and valsartan.  MEDICATIONS:  Current Outpatient Medications  Medication Sig Dispense Refill  . amLODipine (NORVASC) 5 MG tablet Take 5 mg by mouth daily.    . carvedilol (COREG) 6.25 MG tablet Take 12.5 mg by mouth 2 (two) times daily with a meal.     . cetirizine (ZYRTEC) 10 MG tablet Take 10 mg by mouth daily.    . Cholecalciferol (VITAMIN D3) 10000 units TABS Take by mouth.    . ferrous sulfate (FEOSOL) 325 (65 FE) MG tablet Take 325 mg by mouth daily with breakfast.    . FLUoxetine HCl 60 MG TABS Take 60 mg by mouth.    . olmesartan-hydrochlorothiazide (BENICAR HCT) 40-25 MG tablet Take by mouth.    . pyridostigmine (MESTINON) 60 MG tablet Take 1 tablet (60 mg total) by mouth 3 (three) times daily. 90 tablet 11  . ranitidine (ZANTAC) 150 MG tablet Take by mouth.     No current facility-administered medications for this visit.     REVIEW OF SYSTEMS:    Constitutional:  Denies fevers, chills or abnormal night sweats Eyes: Denies blurriness of vision, double vision or watery eyes Ears, nose, mouth, throat, and face: Denies mucositis or sore throat Respiratory: Denies cough, dyspnea or wheezes Cardiovascular: Denies palpitation, chest discomfort or lower extremity swelling Gastrointestinal:  Denies nausea, heartburn or change in bowel habits Skin: Denies abnormal skin rashes Lymphatics: Denies new lymphadenopathy or easy bruising Neurological:Denies numbness, tingling or new weaknesses (+) history of treated MG Behavioral/Psych: Mood is stable, no new changes  All other systems were reviewed with the patient and are negative.  PHYSICAL EXAMINATION:  Vitals:   05/23/18 1319  BP: (!) 151/78  Pulse: 70  Resp: 20  Temp: 98.9 F (37.2 C)  SpO2: 96%   GENERAL:alert, no distress and comfortable SKIN: skin color, texture, turgor are normal, no rashes or significant lesions Ext: no edema Breast exam declined by pt   LABORATORY DATA:  I have reviewed the data as listed CBC Latest Ref Rng & Units 07/22/2016  WBC 4.0 - 10.5 K/uL 8.8  Hemoglobin 12.0 - 15.0 g/dL 11.8(L)  Hematocrit 36.0 - 46.0 % 36.6  Platelets 150 - 400 K/uL 406(H)   CMP Latest Ref Rng & Units 07/22/2016  Glucose 65 - 99 mg/dL 123(H)  BUN 6 - 20 mg/dL 8  Creatinine 0.44 - 1.00 mg/dL 0.84  Sodium 135 - 145 mmol/L 136  Potassium 3.5 - 5.1 mmol/L 3.7  Chloride 101 - 111 mmol/L 103  CO2 22 - 32 mmol/L 26  Calcium 8.9 - 10.3 mg/dL 9.3   Pathology 04/04/2018 Genetic Testing      03/26/2018 Genetic Testing   RADIOGRAPHIC STUDIES: I have personally reviewed the radiological images as listed and agreed with the findings in the report. No results found.  ASSESSMENT & PLAN:  Rachael Hicks is a 47 y.o. female with family history of breast and GI malignancy.   1. PMS2 mutation related Lynch Syndrome -Genetic testing was recommended by Dr. Radene Knee due to maternal aunt and  first cousin with breast cancer. Her cousin had breast cancer at age 59. Maternal grandfather had GI cancer at age under 76. -I reviewed her genetic test results. -we discussed that this is a common genetic mutations which causes Lynch syndrome, however it cause less risk of malignancies than MLH1 or MSH2 related Lynch syndrome.  -patients with PMS 2 mutations carriers 15-20% lifetime risk of colon cancer by age of 2,  With the  media age of onset in 46s, and 15% lifetime risk of endometrial cancer by age of 33, with media age of onset 47 years old. -PMS2 mutation carrier also has slightly increased risk of extra colonoc malignancy, such as stomach, ovary, hepatobiliary track, urinary tract, small bowel, brain, and the combined risk for these malignancies is 6% to age 81. Routine screening for these malignancy are not established and not recommended. She has no family history of pancreatic cancer, routine screening is not recommended.  -I strongly recommend her to start screening colonoscopy, every one to 2 years, to detected earlier stage colon cancer or precancer lesions. We also discussed varous other screening tools such as CT colonography and stool test.  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 Paulita Fujita and colonoscopy soon.  -I also discussed the benefit of aspirin to reduce risk of colon cancer. This has been showed in multiple clinical trials, but has not been the standard recommendation in the high-risk population like her. Side effects of aspirin were discussed with patient. She is interested.  -I also recommend prophylactic hysterectomy. She has no plan to have more children, and agrees to have this done through her gynecologist. She is scheduled to have BSO and hysterectomy by Dr. Radene Knee on 06/16/2018 -F/u as needed in the future.  2. Risk of breast cancer  -We also discussed risk of breast cancer in her case.  Inj syndrome does  not particularly increased risk of pancreatic cancer, however based on her genetic testing results, her estimated lifetime risk of breast cancer is 24%, which is considered high risk.  She does not need prophylactic mastectomy based on the genetic testing results, however I encouraged her to continue screening mammogram and screening breast MRI once a year, 6 months apart for each test.  She has been doing mammogram and breast MRI annually, and agrees with the plan. -We also discussed risk reduction for breast cancer, including healthy diet, exercise, weight loss, avoid hormonal replacement after BSO, etc, she agrees.  We discussed the benefits and potential side effect of chemoprevention with tamoxifen or anastrozole, I do not feel strongly she needs it.  3. Hypertension -She is currently on  Amlodipine, carvediol, and HTCZ  Plan:  -F/u as needed -She will have a mammogram in the next 2 months, and screening breast MRI 6 months after and continue annually  -She will follow-up with Dr. Paulita Fujita for screening colonoscopy every 1 to 2 years, and endoscopy every 3-5 years  -she is scheduled to have BSO and hysterectomy by Dr. Radene Knee in October -I do not recommend routine screening for other cancers    No orders of the defined types were placed in this encounter.   All questions were answered. The patient knows to call the clinic with any problems, questions or concerns. I spent 30 minutes counseling the patient face to face. The total time spent in the appointment was 40 minutes and more than 50% was on counseling.  Dierdre Searles Dweik am acting as scribe for Dr. Truitt Merle.  I have reviewed the above documentation for accuracy and completeness, and I agree with the above.   Truitt Merle   05/23/2018

## 2018-05-23 NOTE — Telephone Encounter (Signed)
Spoke with patient this morning asked if she could come in earlier around 12:45 for 1:00 appointment with Dr. Burr Medico. She agreed, scheduling message was sent.

## 2018-05-26 ENCOUNTER — Telehealth: Payer: Self-pay | Admitting: Hematology

## 2018-05-26 NOTE — Telephone Encounter (Signed)
No los 9/13  

## 2018-05-28 NOTE — H&P (Signed)
Patient name Rachael Hicks, Rachael Hicks DICTATION# [id number] CSN# [id number]  Arvella Nigh, MD 05/28/2018 5:45 AM

## 2018-05-28 NOTE — H&P (Signed)
NAME: Rachael Hicks, Rachael Hicks MEDICAL RECORD OV:[id number] ACCOUNT [id number] DATE OF BIRTH:05-15-1971 FACILITY: [facility] LOCATION:  PHYSICIAN:Myrah Strawderman Sherran Needs, MD  HISTORY AND PHYSICAL  DATE OF ADMISSION:  06/16/2018  DATE OF SURGERY:  10/07 at [facility] outpatient area.  HISTORY OF PRESENT ILLNESS:  The patient is a 47 year old gravida 2, para 2, married female who presents now at the present time to undergo a laparoscopic-assisted vaginal hysterectomy with bilateral salpingo-oophorectomy.  The patient is positive for  genetic mutation for PNS2 gene.  This puts her at higher risk of both colorectal as well as endometrial and ovarian cancer.  She has been having significant issues with her periods.  She has abnormal uterine bleeding with IUD as well as significant pain  and discomfort with her cycles.  She also has a history of myasthenia gravis.  This tends to get worse before her cycles.  We did a saline infusion ultrasound that was unremarkable.  Endometrial sampling was obtained and was benign.  In view of abnormal  bleeding, pelvic pain and genetic mutation, the patient presents for the above noted surgery.  ALLERGIES:  THE PATIENT IS ALLERGIC TO LISINOPRIL.  MEDICATIONS:  She is on fluoxetine 60 mg daily, amlodipine 5 mg daily, Benicar with hydrochlorothiazide 40/25 daily, carvedilol 25 mg twice a day, Mestinon 30 mg as needed and otherwise supplementations.    PAST MEDICAL HISTORY:  History of myasthenia gravis as well as hypertension.  Also, migraine headaches.  She has had 2 vaginal deliveries.  PAST SURGICAL HISTORY:  She reports no surgical history.  SOCIAL HISTORY:  Reveals no tobacco or alcohol use.  FAMILY HISTORY:  Noncontributory.  REVIEW OF SYSTEMS:  Noncontributory.  PHYSICAL EXAMINATION: VITAL SIGNS:  Afebrile, stable vital signs. HEENT:  The patient is normocephalic.  Pupils equal, round, reactive to light and accommodation.  Extraocular movements are intact.   Sclerae and conjunctivae to clear.  Oropharynx clear. NECK:  Without thyromegaly. LUNGS:  Clear. HEART:  Regular rhythm and rate without murmurs or gallops.  There is no carotid or abdominal bruits. ABDOMEN:  Her abdominal exam is benign.  No masses, organomegaly or tenderness. PELVIC:  Normal external genitalia.  Vaginal mucosa is clear.  Cervix unremarkable.  Uterus normal size, shape and contour.  Adnexa free of masses or tenderness. EXTREMITIES:  Mild edema. NEUROLOGIC:  Grossly within normal limits.  ASSESSMENT: 1.  Increasing menorrhagia and dysmenorrhea. 2.  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 3.  Myasthenia gravis. 4.  Hypertension.  PLAN:  The patient to undergo a laparoscopic-assisted vaginal hysterectomy, bilateral salpingo-oophorectomy.  The nature of the procedures have been discussed.  The risks have been explained including the risk of infection.  Risk of hemorrhage that could  require transfusion with the risk of AIDS or hepatitis.  Risk of injury to adjacent organs including bladder, bowel or ureters.  Risk of deep venous thrombosis and pulmonary embolus.  The patient does understand risks and indications.  TN/NUANCE  D:05/28/2018 T:05/28/2018 JOB:[id number]

## 2018-05-29 ENCOUNTER — Other Ambulatory Visit: Payer: Self-pay | Admitting: Obstetrics and Gynecology

## 2018-05-29 DIAGNOSIS — Z1231 Encounter for screening mammogram for malignant neoplasm of breast: Secondary | ICD-10-CM

## 2018-05-30 ENCOUNTER — Ambulatory Visit: Payer: BLUE CROSS/BLUE SHIELD | Admitting: Neurology

## 2018-05-30 DIAGNOSIS — H02402 Unspecified ptosis of left eyelid: Secondary | ICD-10-CM | POA: Diagnosis not present

## 2018-06-02 DIAGNOSIS — Z85038 Personal history of other malignant neoplasm of large intestine: Secondary | ICD-10-CM | POA: Diagnosis not present

## 2018-06-02 DIAGNOSIS — Z1509 Genetic susceptibility to other malignant neoplasm: Secondary | ICD-10-CM | POA: Diagnosis not present

## 2018-06-02 DIAGNOSIS — D125 Benign neoplasm of sigmoid colon: Secondary | ICD-10-CM | POA: Diagnosis not present

## 2018-06-02 DIAGNOSIS — K317 Polyp of stomach and duodenum: Secondary | ICD-10-CM | POA: Diagnosis not present

## 2018-06-02 DIAGNOSIS — K648 Other hemorrhoids: Secondary | ICD-10-CM | POA: Diagnosis not present

## 2018-06-04 NOTE — Patient Instructions (Addendum)
Rachael Hicks  06/04/2018      Your procedure is scheduled on   06-16-18  Report to [place]  at        [place].  Call this number if you have problems the morning of surgery:[phone]  OUR ADDRESS IS [place], WE ARE LOCATED [facility] THE [facility] WITH [facility].   Remember:  Do not eat food or drink liquids after midnight.   Take these medicines the morning of surgery with A SIP OF WATER  Zantac, paxil, carvedilol, amlodipine    Do not wear jewelry, make-up or nail polish.  Do not wear lotions, powders, or perfumes, or deoderant.  Do not shave 48 hours prior to surgery.    Do not bring valuables to the hospital.  [facility] is not responsible for any belongings or valuables.  Contacts, dentures or bridgework may not be worn into surgery.  Leave your suitcase in the car.  After surgery it may be brought to your room.  For patients admitted to the hospital, discharge time will be determined by your treatment team.  Patients discharged the day of surgery will not be allowed to drive home.   Special instructions:    Please read over the following fact sheets that you were given:       [facility] - Preparing for Surgery Before surgery, you can play an important role.  Because skin is not sterile, your skin needs to be as free of germs as possible.  You can reduce the number of germs on your skin by washing with CHG (chlorahexidine gluconate) soap before surgery.  CHG is an antiseptic cleaner which kills germs and bonds with the skin to continue killing germs even after washing. Please DO NOT use if you have an allergy to CHG or antibacterial soaps.  If your skin becomes reddened/irritated stop using the CHG and inform your nurse when you arrive at Short Stay. Do not shave (including legs and underarms) for at least 48 hours prior to the first CHG shower.  You may shave your face/neck. Please follow these instructions  carefully:  1.  Shower with CHG Soap the night before surgery and the  morning of Surgery.  2.  If you choose to wash your hair, wash your hair first as usual with your  normal  shampoo.  3.  After you shampoo, rinse your hair and body thoroughly to remove the  shampoo.                           4.  Use CHG as you would any other liquid soap.  You can apply chg directly  to the skin and wash                       Gently with a scrungie or clean washcloth.  5.  Apply the CHG Soap to your body ONLY FROM THE NECK DOWN.   Do not use on face/ open                           Wound or open sores. Avoid contact with eyes, ears mouth and genitals (private parts).                       Wash face,  Genitals (private parts) with your normal soap.  6.  Wash thoroughly, paying special attention to the area where your surgery  will be performed.  7.  Thoroughly rinse your body with warm water from the neck down.  8.  DO NOT shower/wash with your normal soap after using and rinsing off  the CHG Soap.                9.  Pat yourself dry with a clean towel.            10.  Wear clean pajamas.            11.  Place clean sheets on your bed the night of your first shower and do not  sleep with pets. Day of Surgery : Do not apply any lotions/deodorants the morning of surgery.  Please wear clean clothes to the hospital/surgery center.  FAILURE TO FOLLOW THESE INSTRUCTIONS MAY RESULT IN THE CANCELLATION OF YOUR SURGERY PATIENT SIGNATURE_________________________________  NURSE SIGNATURE__________________________________  ________________________________________________________________________

## 2018-06-05 DIAGNOSIS — K317 Polyp of stomach and duodenum: Secondary | ICD-10-CM | POA: Diagnosis not present

## 2018-06-05 DIAGNOSIS — D125 Benign neoplasm of sigmoid colon: Secondary | ICD-10-CM | POA: Diagnosis not present

## 2018-06-09 DIAGNOSIS — N939 Abnormal uterine and vaginal bleeding, unspecified: Secondary | ICD-10-CM | POA: Diagnosis not present

## 2018-06-10 ENCOUNTER — Other Ambulatory Visit: Payer: Self-pay

## 2018-06-10 ENCOUNTER — Encounter (HOSPITAL_COMMUNITY): Payer: Self-pay

## 2018-06-10 ENCOUNTER — Encounter (HOSPITAL_COMMUNITY)
Admission: RE | Admit: 2018-06-10 | Discharge: 2018-06-10 | Disposition: A | Discharge status: Home / Self Care | Payer: BLUE CROSS/BLUE SHIELD | Source: Ambulatory Visit | Attending: Obstetrics and Gynecology | Admitting: Obstetrics and Gynecology

## 2018-06-10 DIAGNOSIS — N939 Abnormal uterine and vaginal bleeding, unspecified: Secondary | ICD-10-CM | POA: Insufficient documentation

## 2018-06-10 DIAGNOSIS — Z01818 Encounter for other preprocedural examination: Secondary | ICD-10-CM | POA: Diagnosis not present

## 2018-06-10 DIAGNOSIS — N946 Dysmenorrhea, unspecified: Secondary | ICD-10-CM | POA: Diagnosis not present

## 2018-06-10 HISTORY — DX: Palpitations: R00.2

## 2018-06-10 HISTORY — DX: Sleep apnea, unspecified: G47.30

## 2018-06-10 HISTORY — DX: Myasthenia gravis without (acute) exacerbation: G70.00

## 2018-06-10 HISTORY — DX: Other specified postprocedural states: Z98.890

## 2018-06-10 HISTORY — DX: Nausea with vomiting, unspecified: R11.2

## 2018-06-10 HISTORY — DX: Prediabetes: R73.03

## 2018-06-10 LAB — CBC
HCT: 39.7 % (ref 36.0–46.0)
HEMOGLOBIN: 13.1 g/dL (ref 12.0–15.0)
MCH: 27.9 pg (ref 26.0–34.0)
MCHC: 33 g/dL (ref 30.0–36.0)
MCV: 84.6 fL (ref 78.0–100.0)
PLATELETS: 337 10*3/uL (ref 150–400)
RBC: 4.69 MIL/uL (ref 3.87–5.11)
RDW: 13.2 % (ref 11.5–15.5)
WBC: 8.4 10*3/uL (ref 4.0–10.5)

## 2018-06-10 LAB — COMPREHENSIVE METABOLIC PANEL
ALBUMIN: 4.1 g/dL (ref 3.5–5.0)
ALK PHOS: 80 U/L (ref 38–126)
ALT: 18 U/L (ref 0–44)
AST: 17 U/L (ref 15–41)
Anion gap: 9 (ref 5–15)
BUN: 12 mg/dL (ref 6–20)
CHLORIDE: 105 mmol/L (ref 98–111)
CO2: 26 mmol/L (ref 22–32)
CREATININE: 0.95 mg/dL (ref 0.44–1.00)
Calcium: 9.6 mg/dL (ref 8.9–10.3)
GFR calc non Af Amer: >60 mL/min (ref >60)
GLUCOSE: 108 mg/dL — AB (ref 70–99)
Potassium: 3.9 mmol/L (ref 3.5–5.1)
SODIUM: 140 mmol/L (ref 135–145)
Total Bilirubin: 0.5 mg/dL (ref 0.3–1.2)
Total Protein: 7.6 g/dL (ref 6.5–8.1)

## 2018-06-10 LAB — HCG, SERUM, QUALITATIVE: Preg, Serum: NEGATIVE

## 2018-06-10 LAB — ABO/RH: ABO/RH(D): A NEG

## 2018-06-11 LAB — HEMOGLOBIN A1C
Hgb A1c MFr Bld: 6.1 % — ABNORMAL HIGH (ref 4.8–5.6)
Mean Plasma Glucose: 128 mg/dL

## 2018-06-16 ENCOUNTER — Ambulatory Visit (HOSPITAL_BASED_OUTPATIENT_CLINIC_OR_DEPARTMENT_OTHER): Payer: BLUE CROSS/BLUE SHIELD | Admitting: Anesthesiology

## 2018-06-16 ENCOUNTER — Encounter (HOSPITAL_BASED_OUTPATIENT_CLINIC_OR_DEPARTMENT_OTHER)
Admission: RE | Disposition: A | Discharge status: Home / Self Care | Payer: Self-pay | Source: Other Acute Inpatient Hospital | Attending: Obstetrics and Gynecology

## 2018-06-16 ENCOUNTER — Encounter (HOSPITAL_BASED_OUTPATIENT_CLINIC_OR_DEPARTMENT_OTHER): Payer: Self-pay

## 2018-06-16 ENCOUNTER — Observation Stay (HOSPITAL_BASED_OUTPATIENT_CLINIC_OR_DEPARTMENT_OTHER)
Admission: RE | Admit: 2018-06-16 | Discharge: 2018-06-17 | Disposition: A | Discharge status: Home / Self Care | Payer: BLUE CROSS/BLUE SHIELD | Source: Other Acute Inpatient Hospital | Attending: Obstetrics and Gynecology | Admitting: Obstetrics and Gynecology

## 2018-06-16 DIAGNOSIS — N939 Abnormal uterine and vaginal bleeding, unspecified: Secondary | ICD-10-CM | POA: Diagnosis not present

## 2018-06-16 DIAGNOSIS — D259 Leiomyoma of uterus, unspecified: Principal | ICD-10-CM | POA: Insufficient documentation

## 2018-06-16 DIAGNOSIS — I10 Essential (primary) hypertension: Secondary | ICD-10-CM | POA: Insufficient documentation

## 2018-06-16 DIAGNOSIS — Z79899 Other long term (current) drug therapy: Secondary | ICD-10-CM | POA: Insufficient documentation

## 2018-06-16 DIAGNOSIS — Z888 Allergy status to other drugs, medicaments and biological substances status: Secondary | ICD-10-CM | POA: Diagnosis not present

## 2018-06-16 DIAGNOSIS — N946 Dysmenorrhea, unspecified: Secondary | ICD-10-CM | POA: Diagnosis not present

## 2018-06-16 DIAGNOSIS — Z90710 Acquired absence of both cervix and uterus: Secondary | ICD-10-CM | POA: Diagnosis present

## 2018-06-16 DIAGNOSIS — Z1509 Genetic susceptibility to other malignant neoplasm: Secondary | ICD-10-CM | POA: Diagnosis not present

## 2018-06-16 DIAGNOSIS — G7000 Myasthenia gravis without (acute) exacerbation: Secondary | ICD-10-CM | POA: Diagnosis not present

## 2018-06-16 DIAGNOSIS — N8301 Follicular cyst of right ovary: Secondary | ICD-10-CM | POA: Diagnosis not present

## 2018-06-16 DIAGNOSIS — R102 Pelvic and perineal pain: Secondary | ICD-10-CM | POA: Diagnosis not present

## 2018-06-16 DIAGNOSIS — D271 Benign neoplasm of left ovary: Secondary | ICD-10-CM | POA: Diagnosis not present

## 2018-06-16 DIAGNOSIS — Z1502 Genetic susceptibility to malignant neoplasm of ovary: Secondary | ICD-10-CM | POA: Diagnosis not present

## 2018-06-16 HISTORY — PX: CYSTOSCOPY: SHX5120

## 2018-06-16 HISTORY — PX: LAPAROSCOPIC VAGINAL HYSTERECTOMY WITH SALPINGO OOPHORECTOMY: SHX6681

## 2018-06-16 LAB — TYPE AND SCREEN
ABO/RH(D): A NEG
Antibody Screen: NEGATIVE

## 2018-06-16 SURGERY — HYSTERECTOMY, VAGINAL, LAPAROSCOPY-ASSISTED, WITH SALPINGO-OOPHORECTOMY
Anesthesia: General | Laterality: Bilateral

## 2018-06-16 MED ORDER — LACTATED RINGERS IV SOLN
INTRAVENOUS | Status: DC
  Administered 2018-06-16 (×2): via INTRAVENOUS
  Filled 2018-06-16: qty 1000

## 2018-06-16 MED ORDER — BUPIVACAINE HCL (PF) 0.25 % IJ SOLN
INTRAMUSCULAR | Status: DC | PRN
  Administered 2018-06-16: 7 mL

## 2018-06-16 MED ORDER — ONDANSETRON HCL 4 MG/2ML IJ SOLN
4.0000 mg | Freq: Four times a day (QID) | INTRAMUSCULAR | Status: DC | PRN
  Filled 2018-06-16: qty 2

## 2018-06-16 MED ORDER — CEFAZOLIN SODIUM-DEXTROSE 2-4 GM/100ML-% IV SOLN
INTRAVENOUS | Status: AC
  Filled 2018-06-16: qty 100

## 2018-06-16 MED ORDER — DEXAMETHASONE SODIUM PHOSPHATE 10 MG/ML IJ SOLN
INTRAMUSCULAR | Status: DC | PRN
  Administered 2018-06-16: 10 mg via INTRAVENOUS

## 2018-06-16 MED ORDER — WHITE PETROLATUM EX OINT
TOPICAL_OINTMENT | CUTANEOUS | Status: AC
  Filled 2018-06-16: qty 5

## 2018-06-16 MED ORDER — ONDANSETRON HCL 4 MG/2ML IJ SOLN
INTRAMUSCULAR | Status: AC
  Filled 2018-06-16: qty 2

## 2018-06-16 MED ORDER — LIDOCAINE 2% (20 MG/ML) 5 ML SYRINGE
INTRAMUSCULAR | Status: DC | PRN
  Administered 2018-06-16: 70 mg via INTRAVENOUS

## 2018-06-16 MED ORDER — DIPHENHYDRAMINE HCL 50 MG/ML IJ SOLN
12.5000 mg | Freq: Four times a day (QID) | INTRAMUSCULAR | Status: DC | PRN
  Filled 2018-06-16: qty 0.25

## 2018-06-16 MED ORDER — DIPHENHYDRAMINE HCL 12.5 MG/5ML PO ELIX
12.5000 mg | ORAL_SOLUTION | Freq: Four times a day (QID) | ORAL | Status: DC | PRN
  Filled 2018-06-16: qty 5

## 2018-06-16 MED ORDER — SODIUM CHLORIDE 0.9 % IR SOLN
Status: DC | PRN
  Administered 2018-06-16: 3000 mL

## 2018-06-16 MED ORDER — MIDAZOLAM HCL 2 MG/2ML IJ SOLN
INTRAMUSCULAR | Status: AC
  Filled 2018-06-16: qty 2

## 2018-06-16 MED ORDER — ONDANSETRON HCL 4 MG/2ML IJ SOLN
INTRAMUSCULAR | Status: DC | PRN
  Administered 2018-06-16: 4 mg via INTRAVENOUS

## 2018-06-16 MED ORDER — FLUOXETINE HCL 20 MG PO CAPS
60.0000 mg | ORAL_CAPSULE | Freq: Every day | ORAL | Status: DC
  Filled 2018-06-16: qty 3

## 2018-06-16 MED ORDER — MENTHOL 3 MG MT LOZG
1.0000 | LOZENGE | OROMUCOSAL | Status: DC | PRN
  Filled 2018-06-16: qty 9

## 2018-06-16 MED ORDER — SUGAMMADEX SODIUM 200 MG/2ML IV SOLN
INTRAVENOUS | Status: DC | PRN
  Administered 2018-06-16: 250 mg via INTRAVENOUS

## 2018-06-16 MED ORDER — LIDOCAINE HCL (CARDIAC) PF 100 MG/5ML IV SOSY
PREFILLED_SYRINGE | INTRAVENOUS | Status: AC
  Filled 2018-06-16: qty 5

## 2018-06-16 MED ORDER — CARVEDILOL 25 MG PO TABS
25.0000 mg | ORAL_TABLET | Freq: Two times a day (BID) | ORAL | Status: DC
  Administered 2018-06-16: 25 mg via ORAL
  Filled 2018-06-16: qty 1

## 2018-06-16 MED ORDER — MIDAZOLAM HCL 2 MG/2ML IJ SOLN
INTRAMUSCULAR | Status: DC | PRN
  Administered 2018-06-16: 2 mg via INTRAVENOUS

## 2018-06-16 MED ORDER — FENTANYL CITRATE (PF) 100 MCG/2ML IJ SOLN
INTRAMUSCULAR | Status: DC | PRN
  Administered 2018-06-16 (×2): 25 ug via INTRAVENOUS
  Administered 2018-06-16 (×3): 50 ug via INTRAVENOUS

## 2018-06-16 MED ORDER — PROPOFOL 10 MG/ML IV BOLUS
INTRAVENOUS | Status: DC | PRN
  Administered 2018-06-16: 170 mg via INTRAVENOUS

## 2018-06-16 MED ORDER — ROCURONIUM BROMIDE 10 MG/ML (PF) SYRINGE
PREFILLED_SYRINGE | INTRAVENOUS | Status: DC | PRN
  Administered 2018-06-16: 20 mg via INTRAVENOUS
  Administered 2018-06-16: 10 mg via INTRAVENOUS
  Administered 2018-06-16: 35 mg via INTRAVENOUS
  Administered 2018-06-16: 10 mg via INTRAVENOUS
  Administered 2018-06-16: 20 mg via INTRAVENOUS
  Administered 2018-06-16: 10 mg via INTRAVENOUS

## 2018-06-16 MED ORDER — OXYCODONE HCL 5 MG/5ML PO SOLN
5.0000 mg | Freq: Once | ORAL | Status: DC | PRN
  Filled 2018-06-16: qty 5

## 2018-06-16 MED ORDER — DEXAMETHASONE SODIUM PHOSPHATE 10 MG/ML IJ SOLN
INTRAMUSCULAR | Status: AC
  Filled 2018-06-16: qty 1

## 2018-06-16 MED ORDER — GLYCOPYRROLATE PF 0.2 MG/ML IJ SOSY
PREFILLED_SYRINGE | INTRAMUSCULAR | Status: DC | PRN
  Administered 2018-06-16: .2 mg via INTRAVENOUS

## 2018-06-16 MED ORDER — SUGAMMADEX SODIUM 500 MG/5ML IV SOLN
INTRAVENOUS | Status: AC
  Filled 2018-06-16: qty 5

## 2018-06-16 MED ORDER — FLUORESCEIN SODIUM 10 % IV SOLN
INTRAVENOUS | Status: DC | PRN
  Administered 2018-06-16: 3 mL via INTRAVENOUS
  Administered 2018-06-16: 2 mL via INTRAVENOUS

## 2018-06-16 MED ORDER — SODIUM CHLORIDE 0.9% FLUSH
9.0000 mL | INTRAVENOUS | Status: DC | PRN
  Filled 2018-06-16: qty 10

## 2018-06-16 MED ORDER — OXYCODONE-ACETAMINOPHEN 7.5-325 MG PO TABS
1.0000 | ORAL_TABLET | ORAL | Status: DC | PRN
  Filled 2018-06-16: qty 1

## 2018-06-16 MED ORDER — GLYCOPYRROLATE PF 0.2 MG/ML IJ SOSY
PREFILLED_SYRINGE | INTRAMUSCULAR | Status: AC
  Filled 2018-06-16: qty 1

## 2018-06-16 MED ORDER — AMLODIPINE BESYLATE 5 MG PO TABS
5.0000 mg | ORAL_TABLET | Freq: Every day | ORAL | Status: DC
  Administered 2018-06-17: 5 mg via ORAL
  Filled 2018-06-16 (×2): qty 1

## 2018-06-16 MED ORDER — CEFAZOLIN SODIUM-DEXTROSE 2-4 GM/100ML-% IV SOLN
2.0000 g | INTRAVENOUS | Status: AC
  Administered 2018-06-16: 2 g via INTRAVENOUS
  Filled 2018-06-16: qty 100

## 2018-06-16 MED ORDER — OXYCODONE HCL 5 MG PO TABS
5.0000 mg | ORAL_TABLET | Freq: Once | ORAL | Status: DC | PRN
  Filled 2018-06-16: qty 1

## 2018-06-16 MED ORDER — FENTANYL CITRATE (PF) 250 MCG/5ML IJ SOLN
INTRAMUSCULAR | Status: AC
  Filled 2018-06-16: qty 5

## 2018-06-16 MED ORDER — LACTATED RINGERS IV SOLN
INTRAVENOUS | Status: DC
  Administered 2018-06-16: 14:00:00 via INTRAVENOUS
  Filled 2018-06-16 (×2): qty 1000

## 2018-06-16 MED ORDER — NALOXONE HCL 0.4 MG/ML IJ SOLN
0.4000 mg | INTRAMUSCULAR | Status: DC | PRN
  Filled 2018-06-16: qty 1

## 2018-06-16 MED ORDER — KETOROLAC TROMETHAMINE 30 MG/ML IJ SOLN
INTRAMUSCULAR | Status: AC
  Filled 2018-06-16: qty 1

## 2018-06-16 MED ORDER — KETOROLAC TROMETHAMINE 30 MG/ML IJ SOLN
INTRAMUSCULAR | Status: DC | PRN
  Administered 2018-06-16: 30 mg via INTRAVENOUS

## 2018-06-16 MED ORDER — FENTANYL CITRATE (PF) 100 MCG/2ML IJ SOLN
25.0000 ug | INTRAMUSCULAR | Status: DC | PRN
  Filled 2018-06-16: qty 1

## 2018-06-16 MED ORDER — HYDROMORPHONE 1 MG/ML IV SOLN
INTRAVENOUS | Status: DC
  Administered 2018-06-16: 13:00:00 via INTRAVENOUS
  Filled 2018-06-16 (×2): qty 25

## 2018-06-16 MED ORDER — ACETAMINOPHEN 325 MG PO TABS
650.0000 mg | ORAL_TABLET | ORAL | Status: DC | PRN
  Filled 2018-06-16: qty 2

## 2018-06-16 MED ORDER — PROPOFOL 10 MG/ML IV BOLUS
INTRAVENOUS | Status: AC
  Filled 2018-06-16: qty 20

## 2018-06-16 MED ORDER — ROCURONIUM BROMIDE 100 MG/10ML IV SOLN
INTRAVENOUS | Status: AC
  Filled 2018-06-16: qty 1

## 2018-06-16 MED ORDER — PYRIDOSTIGMINE BROMIDE 60 MG PO TABS
60.0000 mg | ORAL_TABLET | Freq: Three times a day (TID) | ORAL | Status: DC | PRN
  Filled 2018-06-16: qty 1

## 2018-06-16 MED ORDER — ONDANSETRON HCL 4 MG PO TABS
4.0000 mg | ORAL_TABLET | Freq: Four times a day (QID) | ORAL | Status: DC | PRN
  Filled 2018-06-16: qty 1

## 2018-06-16 SURGICAL SUPPLY — 64 items
ADH SKN CLS APL DERMABOND .7 (GAUZE/BANDAGES/DRESSINGS) ×2
APL SRG 38 LTWT LNG FL B (MISCELLANEOUS) ×2
APPLICATOR ARISTA FLEXITIP XL (MISCELLANEOUS) ×2 IMPLANT
BAG RETRIEVAL 10 (BASKET)
BAG RETRIEVAL 10MM (BASKET)
BLADE CLIPPER SURG (BLADE) IMPLANT
CANISTER SUCT 3000ML PPV (MISCELLANEOUS) ×8 IMPLANT
CATH ROBINSON RED A/P 16FR (CATHETERS) ×4 IMPLANT
CLOSURE WOUND 1/4X4 (GAUZE/BANDAGES/DRESSINGS)
COVER BACK TABLE 60X90IN (DRAPES) ×4 IMPLANT
COVER MAYO STAND STRL (DRAPES) ×8 IMPLANT
COVER SURGICAL LIGHT HANDLE (MISCELLANEOUS) ×2 IMPLANT
DERMABOND ADVANCED (GAUZE/BANDAGES/DRESSINGS) ×2
DERMABOND ADVANCED .7 DNX12 (GAUZE/BANDAGES/DRESSINGS) ×2 IMPLANT
DRSG COVADERM PLUS 2X2 (GAUZE/BANDAGES/DRESSINGS) IMPLANT
DRSG OPSITE POSTOP 3X4 (GAUZE/BANDAGES/DRESSINGS) ×4 IMPLANT
DURAPREP 26ML APPLICATOR (WOUND CARE) ×4 IMPLANT
ELECT REM PT RETURN 9FT ADLT (ELECTROSURGICAL) ×4
ELECTRODE REM PT RTRN 9FT ADLT (ELECTROSURGICAL) ×2 IMPLANT
GAUZE 4X4 16PLY RFD (DISPOSABLE) ×4 IMPLANT
GLOVE BIO SURGEON STRL SZ7 (GLOVE) ×16 IMPLANT
GLOVE ECLIPSE 6.5 STRL STRAW (GLOVE) ×8 IMPLANT
HEMOSTAT ARISTA ABSORB 3G PWDR (MISCELLANEOUS) ×2 IMPLANT
HOLDER FOLEY CATH W/STRAP (MISCELLANEOUS) ×4 IMPLANT
KIT TURNOVER CYSTO (KITS) ×4 IMPLANT
NEEDLE INSUFFLATION 120MM (ENDOMECHANICALS) ×2 IMPLANT
NS IRRIG 500ML POUR BTL (IV SOLUTION) ×4 IMPLANT
PACK LAVH (CUSTOM PROCEDURE TRAY) ×4 IMPLANT
PACK ROBOTIC GOWN (GOWN DISPOSABLE) ×2 IMPLANT
PACK TRENDGUARD 450 HYBRID PRO (MISCELLANEOUS) IMPLANT
PAD OB MATERNITY 4.3X12.25 (PERSONAL CARE ITEMS) ×4 IMPLANT
PAD PREP 24X48 CUFFED NSTRL (MISCELLANEOUS) ×4 IMPLANT
SCISSORS LAP 5X45 EPIX DISP (ENDOMECHANICALS) IMPLANT
SEALER TISSUE G2 CVD JAW 45CM (ENDOMECHANICALS) ×4 IMPLANT
SET IRRIG TUBING LAPAROSCOPIC (IRRIGATION / IRRIGATOR) ×4 IMPLANT
SET IRRIG Y TYPE TUR BLADDER L (SET/KITS/TRAYS/PACK) ×2 IMPLANT
SOLUTION ELECTROLUBE (MISCELLANEOUS) IMPLANT
STRIP CLOSURE SKIN 1/4X4 (GAUZE/BANDAGES/DRESSINGS) IMPLANT
SUT VIC AB 0 CT1 18XCR BRD8 (SUTURE) ×4 IMPLANT
SUT VIC AB 0 CT1 36 (SUTURE) ×8 IMPLANT
SUT VIC AB 0 CT1 8-18 (SUTURE) ×12
SUT VIC AB 2-0 CT1 (SUTURE) IMPLANT
SUT VIC AB 2-0 SH 27 (SUTURE)
SUT VIC AB 2-0 SH 27XBRD (SUTURE) IMPLANT
SUT VIC AB 3-0 SH 27 (SUTURE)
SUT VIC AB 3-0 SH 27X BRD (SUTURE) IMPLANT
SUT VICRYL 0 UR6 27IN ABS (SUTURE) IMPLANT
SUT VICRYL 1 TIES 12X18 (SUTURE) ×4 IMPLANT
SUT VICRYL 4-0 PS2 18IN ABS (SUTURE) ×4 IMPLANT
SYR BULB IRRIGATION 50ML (SYRINGE) IMPLANT
SYS BAG RETRIEVAL 10MM (BASKET)
SYSTEM BAG RETRIEVAL 10MM (BASKET) IMPLANT
TOWEL OR 17X24 6PK STRL BLUE (TOWEL DISPOSABLE) ×8 IMPLANT
TRAY FOLEY W/BAG SLVR 14FR (SET/KITS/TRAYS/PACK) ×4 IMPLANT
TRENDGUARD 450 HYBRID PRO PACK (MISCELLANEOUS) ×4
TROCAR BALLN 12MMX100 BLUNT (TROCAR) IMPLANT
TROCAR BLADELESS OPT 12M 100M (ENDOMECHANICALS) IMPLANT
TROCAR OPTI TIP 5M 100M (ENDOMECHANICALS) ×4 IMPLANT
TROCAR XCEL DIL TIP R 11M (ENDOMECHANICALS) ×2 IMPLANT
TUBE CONNECTING 12'X1/4 (SUCTIONS) ×1
TUBE CONNECTING 12X1/4 (SUCTIONS) ×1 IMPLANT
TUBING INSUF HEATED (TUBING) ×4 IMPLANT
WARMER LAPAROSCOPE (MISCELLANEOUS) ×4 IMPLANT
WATER STERILE IRR 500ML POUR (IV SOLUTION) ×4 IMPLANT

## 2018-06-16 NOTE — Progress Notes (Signed)
Patient ID: Rachael Hicks, female   DOB: Apr 30, 1971, 47 y.o.   MRN: [id number] AF VSS ABD SOFT NO BLEEDING ] INC CLEAR FAIR UO

## 2018-06-16 NOTE — Anesthesia Postprocedure Evaluation (Signed)
Anesthesia Post Note  Patient: Rachael Hicks  Procedure(s) Performed: LAPAROSCOPIC ASSISTED VAGINAL HYSTERECTOMY WITH SALPINGO OOPHORECTOMY (Bilateral ) CYSTOSCOPY     Patient location during evaluation: PACU Anesthesia Type: General Level of consciousness: awake and alert Pain management: pain level controlled Vital Signs Assessment: post-procedure vital signs reviewed and stable Respiratory status: spontaneous breathing, nonlabored ventilation, respiratory function stable and patient connected to nasal cannula oxygen Cardiovascular status: blood pressure returned to baseline and stable Postop Assessment: no apparent nausea or vomiting Anesthetic complications: no    Last Vitals:  Vitals:   06/16/18 1115 06/16/18 1130  BP: 136/74 134/73  Pulse: 62 64  Resp: (!) 9 (!) 21  Temp:    SpO2: 96% 94%    Last Pain:  Vitals:   06/16/18 1130  TempSrc:   PainSc: 0-No pain                 Amanpreet Delmont S

## 2018-06-16 NOTE — H&P (Signed)
  History and physical exam unchanged 

## 2018-06-16 NOTE — Anesthesia Procedure Notes (Signed)
Procedure Name: Intubation Date/Time: 06/16/2018 7:35 AM Performed by: Suan Halter, CRNA Pre-anesthesia Checklist: Patient identified, Emergency Drugs available, Suction available and Patient being monitored Patient Re-evaluated:Patient Re-evaluated prior to induction Oxygen Delivery Method: Circle system utilized Preoxygenation: Pre-oxygenation with 100% oxygen Induction Type: IV induction Ventilation: Mask ventilation without difficulty Laryngoscope Size: Glidescope and 3 Grade View: Grade IV Tube type: Oral Tube size: 7.0 mm Number of attempts: 1 Airway Equipment and Method: Stylet and Oral airway Placement Confirmation: ETT inserted through vocal cords under direct vision,  positive ETCO2 and breath sounds checked- equal and bilateral Secured at: 22 cm Tube secured with: Tape Dental Injury: Teeth and Oropharynx as per pre-operative assessment  Comments: Pt with small mouth, large tongue, thick neck and MP class 4 airway.  Glidescope used x 1, airway space small, vocal cords visualized and small glottis as well.  Would recommend glidescope for future intubations.

## 2018-06-16 NOTE — Progress Notes (Signed)
06/16/2018 8:55 PM Pt. Home CPAP checked and set up with Amy Ray RT. Rachael Hicks, Rachael Hicks

## 2018-06-16 NOTE — Anesthesia Preprocedure Evaluation (Signed)
Anesthesia Evaluation  Patient identified by MRN, date of birth, ID band Patient awake    Reviewed: Allergy & Precautions, H&P , NPO status , Patient's Chart, lab work & pertinent test results  History of Anesthesia Complications (+) PONV and history of anesthetic complications  Airway Mallampati: II   Neck ROM: full    Dental   Pulmonary sleep apnea ,    breath sounds clear to auscultation       Cardiovascular hypertension,  Rhythm:regular Rate:Normal     Neuro/Psych  Headaches, PSYCHIATRIC DISORDERS Depression Myasthenia gravis  Neuromuscular disease    GI/Hepatic   Endo/Other    Renal/GU      Musculoskeletal   Abdominal   Peds  Hematology   Anesthesia Other Findings   Reproductive/Obstetrics                             Anesthesia Physical Anesthesia Plan  ASA: II  Anesthesia Plan: General   Post-op Pain Management:    Induction: Intravenous  PONV Risk Score and Plan: 4 or greater and Ondansetron, Dexamethasone, Midazolam, Scopolamine patch - Pre-op and Treatment may vary due to age or medical condition  Airway Management Planned: Oral ETT  Additional Equipment:   Intra-op Plan:   Post-operative Plan: Extubation in OR  Informed Consent: I have reviewed the patients History and Physical, chart, labs and discussed the procedure including the risks, benefits and alternatives for the proposed anesthesia with the patient or authorized representative who has indicated his/her understanding and acceptance.     Plan Discussed with: CRNA, Anesthesiologist and Surgeon  Anesthesia Plan Comments:         Anesthesia Quick Evaluation

## 2018-06-16 NOTE — Brief Op Note (Signed)
06/16/2018  10:15 AM  PATIENT:  Rachael Hicks  47 y.o. female  PRE-OPERATIVE DIAGNOSIS:  abnormal uterine bleeding, dysmenorrhea, positive for PMS 2  POST-OPERATIVE DIAGNOSIS:  abnormal uterine bleeding, dysmenorrhea, positive for PMS 2  PROCEDURE:  Procedure(s) with comments: LAPAROSCOPIC ASSISTED VAGINAL HYSTERECTOMY WITH SALPINGO OOPHORECTOMY (Bilateral) - need bed CYSTOSCOPY  SURGEON:  Surgeon(s) and Role:    * Arvella Nigh, MD - Primary    * Royston Sinner, Colin Benton, MD - Assisting  PHYSICIAN ASSISTANT:   ASSISTANTS: ledger   ANESTHESIA:   local and general  EBL:  600 mL   BLOOD ADMINISTERED:none  DRAINS: Urinary Catheter (Foley)   LOCAL MEDICATIONS USED:  MARCAINE     SPECIMEN:  Source of Specimen:  uterus tubes and ovaries  DISPOSITION OF SPECIMEN:  PATHOLOGY  COUNTS:  YES  TOURNIQUET:  * No tourniquets in log *  DICTATION: .Other Dictation: Dictation Number [id number]  PLAN OF CARE: Admit for overnight observation  PATIENT DISPOSITION:  PACU - hemodynamically stable.   Delay start of Pharmacological VTE agent (>24hrs) due to surgical blood loss or risk of bleeding: no

## 2018-06-16 NOTE — Transfer of Care (Signed)
Immediate Anesthesia Transfer of Care Note  Patient: Rachael Hicks  Procedure(s) Performed: Procedure(s) (LRB): LAPAROSCOPIC ASSISTED VAGINAL HYSTERECTOMY WITH SALPINGO OOPHORECTOMY (Bilateral) CYSTOSCOPY  Patient Location: PACU  Anesthesia Type: General  Level of Consciousness: awake, oriented, sedated and patient cooperative  Airway & Oxygen Therapy: Patient Spontanous Breathing and Patient connected to face mask oxygen  Post-op Assessment: Report given to PACU RN and Post -op Vital signs reviewed and stable  Post vital signs: Reviewed and stable  Complications: No apparent anesthesia complications Last Vitals:  Vitals Value Taken Time  BP 135/91 06/16/2018 10:21 AM  Temp    Pulse 71 06/16/2018 10:24 AM  Resp 13 06/16/2018 10:24 AM  SpO2 97 % 06/16/2018 10:24 AM  Vitals shown include unvalidated device data.  Last Pain:  Vitals:   06/16/18 0624  TempSrc:   PainSc: 0-No pain      Patients Stated Pain Goal: 5 (06/16/18 9741)

## 2018-06-17 ENCOUNTER — Encounter (HOSPITAL_BASED_OUTPATIENT_CLINIC_OR_DEPARTMENT_OTHER): Payer: Self-pay | Admitting: Obstetrics and Gynecology

## 2018-06-17 DIAGNOSIS — G7000 Myasthenia gravis without (acute) exacerbation: Secondary | ICD-10-CM | POA: Diagnosis not present

## 2018-06-17 DIAGNOSIS — R102 Pelvic and perineal pain: Secondary | ICD-10-CM | POA: Diagnosis not present

## 2018-06-17 DIAGNOSIS — Z79899 Other long term (current) drug therapy: Secondary | ICD-10-CM | POA: Diagnosis not present

## 2018-06-17 DIAGNOSIS — N939 Abnormal uterine and vaginal bleeding, unspecified: Secondary | ICD-10-CM | POA: Diagnosis not present

## 2018-06-17 DIAGNOSIS — Z888 Allergy status to other drugs, medicaments and biological substances status: Secondary | ICD-10-CM | POA: Diagnosis not present

## 2018-06-17 DIAGNOSIS — N946 Dysmenorrhea, unspecified: Secondary | ICD-10-CM | POA: Diagnosis not present

## 2018-06-17 DIAGNOSIS — N8301 Follicular cyst of right ovary: Secondary | ICD-10-CM | POA: Diagnosis not present

## 2018-06-17 DIAGNOSIS — I10 Essential (primary) hypertension: Secondary | ICD-10-CM | POA: Diagnosis not present

## 2018-06-17 DIAGNOSIS — D271 Benign neoplasm of left ovary: Secondary | ICD-10-CM | POA: Diagnosis not present

## 2018-06-17 DIAGNOSIS — D259 Leiomyoma of uterus, unspecified: Secondary | ICD-10-CM | POA: Diagnosis not present

## 2018-06-17 LAB — CBC
HCT: 32.8 % — ABNORMAL LOW (ref 36.0–46.0)
Hemoglobin: 10.6 g/dL — ABNORMAL LOW (ref 12.0–15.0)
MCH: 27.7 pg (ref 26.0–34.0)
MCHC: 32.3 g/dL (ref 30.0–36.0)
MCV: 85.6 fL (ref 80.0–100.0)
PLATELETS: 221 10*3/uL (ref 150–400)
RBC: 3.83 MIL/uL — AB (ref 3.87–5.11)
RDW: 13.3 % (ref 11.5–15.5)
WBC: 13.6 10*3/uL — ABNORMAL HIGH (ref 4.0–10.5)

## 2018-06-17 MED ORDER — OLMESARTAN MEDOXOMIL 40 MG PO TABS
40.0000 mg | ORAL_TABLET | ORAL | Status: AC
  Administered 2018-06-17: 40 mg via ORAL
  Filled 2018-06-17: qty 1

## 2018-06-17 MED ORDER — HYDROCHLOROTHIAZIDE 25 MG PO TABS
25.0000 mg | ORAL_TABLET | ORAL | Status: AC
  Administered 2018-06-17: 25 mg via ORAL
  Filled 2018-06-17: qty 1

## 2018-06-17 MED ORDER — OXYCODONE-ACETAMINOPHEN 7.5-325 MG PO TABS: 1.0000 | ORAL_TABLET | ORAL | 0 refills | Status: AC | PRN

## 2018-06-17 NOTE — Progress Notes (Signed)
23.5mg  of Dilaudid PCA wasted in "stericycle" narcotic waste container witness by Placido Sou, RN. Lenna Sciara, RN

## 2018-06-17 NOTE — Op Note (Signed)
NAME: Rachael Hicks, Rachael Hicks MEDICAL RECORD [id number] ACCOUNT [id number] DATE OF BIRTH:01/18/1971 FACILITY: [facility] LOCATION: [facility] PHYSICIAN:Adrean Findlay S. 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  OPERATIVE PROCEDURE:   1.  Laparoscopic assisted vaginal hysterectomy 2.  Bilateral salpingo-oophorectomy.   3.  Cystoscopy.  SURGEON:  Darlyn Chamber, MD  ASSISTANT:  Lucillie Garfinkel, MD  ANESTHESIA:  General endotracheal.  ESTIMATED BLOOD LOSS:  750-800 mL.  PACKS:  None.  DRAINS:  Include urethral Foley.  INTRAOPERATIVE BLOOD PLACED:  None.  COMPLICATIONS:  None.  DESCRIPTION OF PROCEDURE:  The patient was taken to the OR and placed in supine position.  After satisfactory level of general endotracheal anesthesia was obtained, the patient was placed in the dorsal lithotomy position using Allen stirrups.  Perineum  and vagina were prepped out with Betadine.  Bladder was then about catheterization.  A Hulka tenaculum was secured on the cervix.  Speculum was then removed.  At this point in time, the abdomen was prepped with DuraPrep and after a period of time, the  patient was draped in sterile field.  Subumbilical incision made with a knife.  Veress needle was entered into the abdominal cavity.  Abdomen was inflated with approximately 3 liters of carbon dioxide.  A 06/20/2018 trocar was put in place.  The  operative laparoscope was introduced.  There was no evidence of injury to adjacent organs.  A 5 mm trocar was put in place in suprapubic area under direct  visualization revealed normal upper abdomen.  Both lateral gutters were clear.  Liver was clear.   We could not see the gallbladder.  The inferior aspect of the stomach was clear.  Could not visualize the appendix.  Uterus was elevated.  Both tubes and ovaries were  unremarkable.  We first went to the right side.  The right ovary was elevated.  The  right ovarian vasculature was cauterized and incised using the EnSeal.  We continued the procedure separating the ovary from its mesenteric attachments up to the round ligament.  The right round ligament was then cauterized and incised.  We then went to  the left side.  Left ovary and tube were elevated.  Left ovarian vasculature was cauterized and incised using the EnSeal.  Mesenteric attachments of the tubes and ovaries were cauterized and incised up to the round ligament, left round ligament was  cauterized and incised.  We had good hemostasis.  Decision was now to go vaginally.  Abdomen was deflated of carbon dioxide.  Laparoscope was then removed.  The patient's legs were repositioned and the Hulka clamp was removed.  Weighted speculum was placed in the vaginal vault.  Cervix grasped with a Ardis Hughs tenaculum.  Her support was excellent.  The vaginal opening and vagina were narrow.  We tried to enter  the cul-de-sac.  We could not.  At this point in time, we identified what was felt to the uterosacral ligaments.  They were clamped, cut and suture ligated with 0 Vicryl.  The reflection of the vaginal mucosa anteriorly was incised and bladder was  dissected superiorly.  Paracervical tissue was clamped, cut and suture ligated with 0 Vicryl.  She had excellent support.  We had a hard time bringing the cervix and uterus down.  She had an extremely long cervix.  Eventually, we were able to enter the  cul-de-sac.  A long nose weighted speculum was introduced.  The remaining uterosacral ligaments were clamped, cut and suture ligated with 0 Vicryl.  A parous uterine tissue was clamped, cut and suture ligated with 0 Vicryl.  We eventually entered the  vesicouterine space.  A retractor was put in place.  Using the clamp, cut, and tie technique with suture ligatures of 0 Vicryl, the parametrium was serially separated from the sides  of the uterus.  Uterus was then flipped and removed off the operative  field.  We had an arterial pumper at the cuff on the right.  We eventually identified and clamped it with a Hulka tenaculum.  We then secured it with 2 free ties of 0 Vicryl.  At this point in time, we had fair hemostasis.  The vaginal cuff was then run  with a running suture of 0 Vicryl.  The vaginal mucosa reapproximated in the midline with interrupted sutures of 0 Vicryl.  At this point in time, we had good hemostasis of the vaginal cuff.  Bladder was in and out catheterization until it was clear.  _____ was reintroduced.  The abdomen was reinflated with carbon dioxide.  Visualization _____ after using the bipolar.  Areas of oozing were controlled.  We put in Arista also.  _____ .  All trocars were removed.  Subumbilical incision was closed with  interrupted subcuticulars of 4-0 Vicryl.  Suprapubic incision was closed with Dermabond.    Cystoscopy was performed.  She was given normal saline.  Visualization revealed spillage of maintained urine from both ureteral orifices.  Cystoscope was removed.  Foley was placed to straight drain.  The patient was taken out of dorsal lithotomy  position, extubated, was transferred to recovery room in good condition.  Sponge, instrument and needle count was correct by circulating nurse multiple times.  AN/NUANCE  D:06/16/2018 T:06/16/2018 JOB:[id number]

## 2018-06-17 NOTE — Progress Notes (Signed)
1 Day Post-Op Procedure(s) (LRB): LAPAROSCOPIC ASSISTED VAGINAL HYSTERECTOMY WITH SALPINGO OOPHORECTOMY (Bilateral) CYSTOSCOPY  Subjective: Patient reports tolerating PO and + flatus.    Objective: I have reviewed patient's vital signs, intake and output and labs.  General: alert GI: soft, non-tender; bowel sounds normal; no masses,  no organomegaly Vaginal Bleeding: minimal  Assessment: s/p Procedure(s) with comments: LAPAROSCOPIC ASSISTED VAGINAL HYSTERECTOMY WITH SALPINGO OOPHORECTOMY (Bilateral) - need bed CYSTOSCOPY: stable  Plan: Discharge home  LOS: 0 days    Rachael Hicks 06/17/2018, 7:09 AM

## 2018-06-17 NOTE — Progress Notes (Addendum)
Patient blood pressure 180/90 accompanied by generalized edema.Patient reports that she hasn't received a Benicar dose in 2 days. Dr. Radene Knee notified via telephone. MD ordered administration of daily 5mg  of Norvasc and olmesartan-hydrochlorothiazide (Benicar HCT) 40-25mg  tablet. Patient has received both medications. Will continue to monitor patient. Lenna Sciara, RN

## 2018-06-17 NOTE — Discharge Summary (Signed)
NAME: Rachael Hicks, Rachael Hicks MEDICAL RECORD [id number] ACCOUNT [id number] DATE OF BIRTH:[birth year] FACILITY: [facility] LOCATION: [facility] PHYSICIAN:Nivan Melendrez Sherran Needs, MD  DISCHARGE SUMMARY  DATE OF DISCHARGE:  06/17/2018  PREOPERATIVE DIAGNOSIS:  Increasing menorrhagia and dysmenorrhea.  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  DISCHARGE DIAGNOSIS:  Increasing menorrhagia and dysmenorrhea.  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  PROCEDURE:  Laparoscopic assisted vaginal hysterectomy, bilateral salpingo-oophorectomy.  Cystoscopy.    For complete history and physical, please see dictated note.  HOSPITAL COURSE:  The patient underwent above noted surgery.  Postop course was unremarkable.  Her postop hemoglobin was 10.6.  At the time of discharge, she was tolerating a diet.  She was passing flatus.  Her abdomen is soft, nontender.  All incisions  were clear.  COMPLICATIONS:  None.  Discharged home in stable condition.     DISPOSITION:  The patient to avoid heavy lifting, vaginal entrance or driving a car.  She is instructed to call should there be fever, nausea, vomiting, excessive bleeding or increasing pain.  Also instructed in signs and symptoms of deep venous  thrombosis and pulmonary embolus.  She will be discharged home on Percocet as needed for pain.  Planned follow up in the office in 1 week.  TN/NUANCE D:06/17/2018 T:06/17/2018 JOB:[id number]

## 2018-06-17 NOTE — Discharge Summary (Signed)
Patient name Malaika, Arnall DICTATION#[id number] CSN# [id number]  Arvella Nigh, MD 06/17/2018 7:13 AM

## 2018-06-27 DIAGNOSIS — R102 Pelvic and perineal pain: Secondary | ICD-10-CM | POA: Diagnosis not present

## 2018-06-27 DIAGNOSIS — D649 Anemia, unspecified: Secondary | ICD-10-CM | POA: Diagnosis not present

## 2018-06-27 DIAGNOSIS — N390 Urinary tract infection, site not specified: Secondary | ICD-10-CM | POA: Diagnosis not present

## 2018-07-08 DIAGNOSIS — G4733 Obstructive sleep apnea (adult) (pediatric): Secondary | ICD-10-CM | POA: Diagnosis not present

## 2018-07-14 ENCOUNTER — Ambulatory Visit
Admission: RE | Admit: 2018-07-14 | Discharge: 2018-07-14 | Disposition: A | Discharge status: Home / Self Care | Payer: BLUE CROSS/BLUE SHIELD | Source: Ambulatory Visit | Attending: Obstetrics and Gynecology | Admitting: Obstetrics and Gynecology

## 2018-07-14 DIAGNOSIS — D509 Iron deficiency anemia, unspecified: Secondary | ICD-10-CM | POA: Diagnosis not present

## 2018-07-14 DIAGNOSIS — Z1231 Encounter for screening mammogram for malignant neoplasm of breast: Secondary | ICD-10-CM | POA: Diagnosis not present

## 2018-07-28 DIAGNOSIS — D509 Iron deficiency anemia, unspecified: Secondary | ICD-10-CM | POA: Diagnosis not present

## 2018-07-29 ENCOUNTER — Other Ambulatory Visit: Payer: Self-pay | Admitting: Obstetrics and Gynecology

## 2018-07-29 DIAGNOSIS — Z803 Family history of malignant neoplasm of breast: Secondary | ICD-10-CM

## 2018-09-01 DIAGNOSIS — D509 Iron deficiency anemia, unspecified: Secondary | ICD-10-CM | POA: Diagnosis not present

## 2018-09-01 DIAGNOSIS — R7303 Prediabetes: Secondary | ICD-10-CM | POA: Diagnosis not present

## 2018-09-01 DIAGNOSIS — I10 Essential (primary) hypertension: Secondary | ICD-10-CM | POA: Diagnosis not present

## 2018-09-01 DIAGNOSIS — E559 Vitamin D deficiency, unspecified: Secondary | ICD-10-CM | POA: Diagnosis not present

## 2018-09-01 DIAGNOSIS — R002 Palpitations: Secondary | ICD-10-CM | POA: Diagnosis not present

## 2018-09-01 DIAGNOSIS — E161 Other hypoglycemia: Secondary | ICD-10-CM | POA: Diagnosis not present

## 2018-09-01 DIAGNOSIS — Z0000 Encounter for general adult medical examination without abnormal findings: Secondary | ICD-10-CM | POA: Diagnosis not present

## 2018-09-18 DIAGNOSIS — L821 Other seborrheic keratosis: Secondary | ICD-10-CM | POA: Diagnosis not present

## 2018-09-18 DIAGNOSIS — D2222 Melanocytic nevi of left ear and external auricular canal: Secondary | ICD-10-CM | POA: Diagnosis not present

## 2018-09-18 DIAGNOSIS — D2261 Melanocytic nevi of right upper limb, including shoulder: Secondary | ICD-10-CM | POA: Diagnosis not present

## 2018-09-18 DIAGNOSIS — Z86018 Personal history of other benign neoplasm: Secondary | ICD-10-CM | POA: Diagnosis not present

## 2018-09-18 DIAGNOSIS — L910 Hypertrophic scar: Secondary | ICD-10-CM | POA: Diagnosis not present

## 2018-10-07 DIAGNOSIS — D225 Melanocytic nevi of trunk: Secondary | ICD-10-CM | POA: Diagnosis not present

## 2018-10-07 DIAGNOSIS — D485 Neoplasm of uncertain behavior of skin: Secondary | ICD-10-CM | POA: Diagnosis not present

## 2018-10-24 DIAGNOSIS — G4733 Obstructive sleep apnea (adult) (pediatric): Secondary | ICD-10-CM | POA: Diagnosis not present

## 2018-11-05 DIAGNOSIS — T8130XA Disruption of wound, unspecified, initial encounter: Secondary | ICD-10-CM | POA: Diagnosis not present

## 2018-11-10 DIAGNOSIS — R29898 Other symptoms and signs involving the musculoskeletal system: Secondary | ICD-10-CM | POA: Diagnosis not present

## 2018-11-10 DIAGNOSIS — H02402 Unspecified ptosis of left eyelid: Secondary | ICD-10-CM | POA: Diagnosis not present

## 2018-11-10 DIAGNOSIS — R5382 Chronic fatigue, unspecified: Secondary | ICD-10-CM | POA: Diagnosis not present

## 2018-11-17 DIAGNOSIS — E7800 Pure hypercholesterolemia, unspecified: Secondary | ICD-10-CM | POA: Diagnosis not present

## 2018-11-17 DIAGNOSIS — R29898 Other symptoms and signs involving the musculoskeletal system: Secondary | ICD-10-CM | POA: Diagnosis not present

## 2018-11-24 DIAGNOSIS — R29898 Other symptoms and signs involving the musculoskeletal system: Secondary | ICD-10-CM | POA: Diagnosis not present

## 2018-12-02 DIAGNOSIS — R29898 Other symptoms and signs involving the musculoskeletal system: Secondary | ICD-10-CM | POA: Diagnosis not present

## 2019-01-19 ENCOUNTER — Other Ambulatory Visit: Payer: BLUE CROSS/BLUE SHIELD

## 2019-02-04 DIAGNOSIS — R29898 Other symptoms and signs involving the musculoskeletal system: Secondary | ICD-10-CM | POA: Diagnosis not present

## 2019-02-06 DIAGNOSIS — R29898 Other symptoms and signs involving the musculoskeletal system: Secondary | ICD-10-CM | POA: Diagnosis not present

## 2019-02-10 DIAGNOSIS — R29898 Other symptoms and signs involving the musculoskeletal system: Secondary | ICD-10-CM | POA: Diagnosis not present

## 2019-02-11 ENCOUNTER — Other Ambulatory Visit: Payer: BLUE CROSS/BLUE SHIELD

## 2019-02-12 DIAGNOSIS — R29898 Other symptoms and signs involving the musculoskeletal system: Secondary | ICD-10-CM | POA: Diagnosis not present

## 2019-02-17 DIAGNOSIS — R29898 Other symptoms and signs involving the musculoskeletal system: Secondary | ICD-10-CM | POA: Diagnosis not present

## 2019-02-23 ENCOUNTER — Other Ambulatory Visit: Payer: BLUE CROSS/BLUE SHIELD

## 2019-02-23 DIAGNOSIS — R29898 Other symptoms and signs involving the musculoskeletal system: Secondary | ICD-10-CM | POA: Diagnosis not present

## 2019-02-27 DIAGNOSIS — R29898 Other symptoms and signs involving the musculoskeletal system: Secondary | ICD-10-CM | POA: Diagnosis not present

## 2019-03-02 ENCOUNTER — Ambulatory Visit
Admission: RE | Admit: 2019-03-02 | Discharge: 2019-03-02 | Disposition: A | Discharge status: Home / Self Care | Payer: BLUE CROSS/BLUE SHIELD | Source: Ambulatory Visit | Attending: Obstetrics and Gynecology | Admitting: Obstetrics and Gynecology

## 2019-03-02 ENCOUNTER — Other Ambulatory Visit: Payer: Self-pay

## 2019-03-02 DIAGNOSIS — Z803 Family history of malignant neoplasm of breast: Secondary | ICD-10-CM

## 2019-03-02 MED ORDER — GADOBUTROL 1 MMOL/ML IV SOLN
10.0000 mL | Freq: Once | INTRAVENOUS | Status: AC | PRN
  Administered 2019-03-02: 10 mL via INTRAVENOUS

## 2019-03-03 DIAGNOSIS — D509 Iron deficiency anemia, unspecified: Secondary | ICD-10-CM | POA: Diagnosis not present

## 2019-03-03 DIAGNOSIS — I10 Essential (primary) hypertension: Secondary | ICD-10-CM | POA: Diagnosis not present

## 2019-03-03 DIAGNOSIS — R29898 Other symptoms and signs involving the musculoskeletal system: Secondary | ICD-10-CM | POA: Diagnosis not present

## 2019-03-03 DIAGNOSIS — R002 Palpitations: Secondary | ICD-10-CM | POA: Diagnosis not present

## 2019-03-03 DIAGNOSIS — R7303 Prediabetes: Secondary | ICD-10-CM | POA: Diagnosis not present

## 2019-03-05 DIAGNOSIS — R29898 Other symptoms and signs involving the musculoskeletal system: Secondary | ICD-10-CM | POA: Diagnosis not present

## 2019-03-10 DIAGNOSIS — R29898 Other symptoms and signs involving the musculoskeletal system: Secondary | ICD-10-CM | POA: Diagnosis not present

## 2019-03-12 DIAGNOSIS — R29898 Other symptoms and signs involving the musculoskeletal system: Secondary | ICD-10-CM | POA: Diagnosis not present

## 2019-03-16 DIAGNOSIS — R29898 Other symptoms and signs involving the musculoskeletal system: Secondary | ICD-10-CM | POA: Diagnosis not present

## 2019-03-19 DIAGNOSIS — R29898 Other symptoms and signs involving the musculoskeletal system: Secondary | ICD-10-CM | POA: Diagnosis not present

## 2019-03-20 DIAGNOSIS — B349 Viral infection, unspecified: Secondary | ICD-10-CM | POA: Diagnosis not present

## 2019-04-06 DIAGNOSIS — R29898 Other symptoms and signs involving the musculoskeletal system: Secondary | ICD-10-CM | POA: Diagnosis not present

## 2019-04-09 DIAGNOSIS — R7303 Prediabetes: Secondary | ICD-10-CM | POA: Diagnosis not present

## 2019-04-09 DIAGNOSIS — Z6841 Body Mass Index (BMI) 40.0 and over, adult: Secondary | ICD-10-CM | POA: Diagnosis not present

## 2019-04-09 DIAGNOSIS — E7800 Pure hypercholesterolemia, unspecified: Secondary | ICD-10-CM | POA: Diagnosis not present

## 2019-04-09 DIAGNOSIS — E559 Vitamin D deficiency, unspecified: Secondary | ICD-10-CM | POA: Diagnosis not present

## 2019-04-14 DIAGNOSIS — Z01419 Encounter for gynecological examination (general) (routine) without abnormal findings: Secondary | ICD-10-CM | POA: Diagnosis not present

## 2019-04-14 DIAGNOSIS — Z6841 Body Mass Index (BMI) 40.0 and over, adult: Secondary | ICD-10-CM | POA: Diagnosis not present

## 2019-04-15 DIAGNOSIS — R29898 Other symptoms and signs involving the musculoskeletal system: Secondary | ICD-10-CM | POA: Diagnosis not present

## 2019-04-20 DIAGNOSIS — R29898 Other symptoms and signs involving the musculoskeletal system: Secondary | ICD-10-CM | POA: Diagnosis not present

## 2019-04-27 DIAGNOSIS — E119 Type 2 diabetes mellitus without complications: Secondary | ICD-10-CM | POA: Diagnosis not present

## 2019-04-28 DIAGNOSIS — G4733 Obstructive sleep apnea (adult) (pediatric): Secondary | ICD-10-CM | POA: Diagnosis not present

## 2019-04-30 DIAGNOSIS — R29898 Other symptoms and signs involving the musculoskeletal system: Secondary | ICD-10-CM | POA: Diagnosis not present

## 2019-05-08 DIAGNOSIS — R29898 Other symptoms and signs involving the musculoskeletal system: Secondary | ICD-10-CM | POA: Diagnosis not present

## 2019-05-11 DIAGNOSIS — R29898 Other symptoms and signs involving the musculoskeletal system: Secondary | ICD-10-CM | POA: Diagnosis not present

## 2019-05-11 DIAGNOSIS — R5382 Chronic fatigue, unspecified: Secondary | ICD-10-CM | POA: Diagnosis not present

## 2019-05-11 DIAGNOSIS — H02402 Unspecified ptosis of left eyelid: Secondary | ICD-10-CM | POA: Diagnosis not present

## 2019-05-12 DIAGNOSIS — R29898 Other symptoms and signs involving the musculoskeletal system: Secondary | ICD-10-CM | POA: Diagnosis not present

## 2019-05-15 DIAGNOSIS — G35 Multiple sclerosis: Secondary | ICD-10-CM | POA: Diagnosis not present

## 2019-05-15 DIAGNOSIS — G950 Syringomyelia and syringobulbia: Secondary | ICD-10-CM | POA: Diagnosis not present

## 2019-05-15 DIAGNOSIS — M5127 Other intervertebral disc displacement, lumbosacral region: Secondary | ICD-10-CM | POA: Diagnosis not present

## 2019-05-15 DIAGNOSIS — M50222 Other cervical disc displacement at C5-C6 level: Secondary | ICD-10-CM | POA: Diagnosis not present

## 2019-05-15 DIAGNOSIS — M5126 Other intervertebral disc displacement, lumbar region: Secondary | ICD-10-CM | POA: Diagnosis not present

## 2019-05-19 DIAGNOSIS — R29898 Other symptoms and signs involving the musculoskeletal system: Secondary | ICD-10-CM | POA: Diagnosis not present

## 2019-05-26 DIAGNOSIS — R29898 Other symptoms and signs involving the musculoskeletal system: Secondary | ICD-10-CM | POA: Diagnosis not present

## 2019-05-26 DIAGNOSIS — M50322 Other cervical disc degeneration at C5-C6 level: Secondary | ICD-10-CM | POA: Diagnosis not present

## 2019-06-02 DIAGNOSIS — R29898 Other symptoms and signs involving the musculoskeletal system: Secondary | ICD-10-CM | POA: Diagnosis not present

## 2019-06-08 DIAGNOSIS — R5382 Chronic fatigue, unspecified: Secondary | ICD-10-CM | POA: Diagnosis not present

## 2019-06-08 DIAGNOSIS — G950 Syringomyelia and syringobulbia: Secondary | ICD-10-CM | POA: Diagnosis not present

## 2019-06-08 DIAGNOSIS — R29898 Other symptoms and signs involving the musculoskeletal system: Secondary | ICD-10-CM | POA: Diagnosis not present

## 2019-07-06 ENCOUNTER — Other Ambulatory Visit: Payer: Self-pay | Admitting: Obstetrics and Gynecology

## 2019-07-06 DIAGNOSIS — Z1231 Encounter for screening mammogram for malignant neoplasm of breast: Secondary | ICD-10-CM

## 2019-08-24 ENCOUNTER — Other Ambulatory Visit: Payer: Self-pay

## 2019-08-24 ENCOUNTER — Ambulatory Visit
Admission: RE | Admit: 2019-08-24 | Discharge: 2019-08-24 | Disposition: A | Discharge status: Home / Self Care | Payer: BLUE CROSS/BLUE SHIELD | Source: Ambulatory Visit | Attending: Obstetrics and Gynecology | Admitting: Obstetrics and Gynecology

## 2019-08-24 DIAGNOSIS — Z1231 Encounter for screening mammogram for malignant neoplasm of breast: Secondary | ICD-10-CM

## 2019-10-05 DIAGNOSIS — Z0000 Encounter for general adult medical examination without abnormal findings: Secondary | ICD-10-CM | POA: Diagnosis not present

## 2019-10-05 DIAGNOSIS — E1169 Type 2 diabetes mellitus with other specified complication: Secondary | ICD-10-CM | POA: Diagnosis not present

## 2019-10-05 DIAGNOSIS — E7800 Pure hypercholesterolemia, unspecified: Secondary | ICD-10-CM | POA: Diagnosis not present

## 2019-10-05 DIAGNOSIS — I10 Essential (primary) hypertension: Secondary | ICD-10-CM | POA: Diagnosis not present

## 2019-10-05 DIAGNOSIS — E559 Vitamin D deficiency, unspecified: Secondary | ICD-10-CM | POA: Diagnosis not present

## 2019-10-05 DIAGNOSIS — D509 Iron deficiency anemia, unspecified: Secondary | ICD-10-CM | POA: Diagnosis not present

## 2019-10-07 DIAGNOSIS — L578 Other skin changes due to chronic exposure to nonionizing radiation: Secondary | ICD-10-CM | POA: Diagnosis not present

## 2019-10-07 DIAGNOSIS — L910 Hypertrophic scar: Secondary | ICD-10-CM | POA: Diagnosis not present

## 2019-10-07 DIAGNOSIS — D2222 Melanocytic nevi of left ear and external auricular canal: Secondary | ICD-10-CM | POA: Diagnosis not present

## 2019-10-07 DIAGNOSIS — L811 Chloasma: Secondary | ICD-10-CM | POA: Diagnosis not present

## 2019-10-07 DIAGNOSIS — L719 Rosacea, unspecified: Secondary | ICD-10-CM | POA: Diagnosis not present

## 2019-11-13 DIAGNOSIS — R253 Fasciculation: Secondary | ICD-10-CM | POA: Diagnosis not present

## 2019-11-13 DIAGNOSIS — R200 Anesthesia of skin: Secondary | ICD-10-CM | POA: Diagnosis not present

## 2019-11-13 DIAGNOSIS — G950 Syringomyelia and syringobulbia: Secondary | ICD-10-CM | POA: Diagnosis not present

## 2019-11-13 DIAGNOSIS — H02402 Unspecified ptosis of left eyelid: Secondary | ICD-10-CM | POA: Diagnosis not present

## 2020-04-05 DIAGNOSIS — K219 Gastro-esophageal reflux disease without esophagitis: Secondary | ICD-10-CM | POA: Diagnosis not present

## 2020-04-05 DIAGNOSIS — E7800 Pure hypercholesterolemia, unspecified: Secondary | ICD-10-CM | POA: Diagnosis not present

## 2020-04-05 DIAGNOSIS — I10 Essential (primary) hypertension: Secondary | ICD-10-CM | POA: Diagnosis not present

## 2020-04-05 DIAGNOSIS — E1169 Type 2 diabetes mellitus with other specified complication: Secondary | ICD-10-CM | POA: Diagnosis not present

## 2020-05-03 DIAGNOSIS — G4733 Obstructive sleep apnea (adult) (pediatric): Secondary | ICD-10-CM | POA: Diagnosis not present

## 2020-05-03 DIAGNOSIS — E860 Dehydration: Secondary | ICD-10-CM | POA: Diagnosis not present

## 2020-05-23 DIAGNOSIS — E119 Type 2 diabetes mellitus without complications: Secondary | ICD-10-CM | POA: Diagnosis not present

## 2020-05-25 DIAGNOSIS — Z01419 Encounter for gynecological examination (general) (routine) without abnormal findings: Secondary | ICD-10-CM | POA: Diagnosis not present

## 2020-05-25 DIAGNOSIS — Z6841 Body Mass Index (BMI) 40.0 and over, adult: Secondary | ICD-10-CM | POA: Diagnosis not present

## 2020-05-27 ENCOUNTER — Other Ambulatory Visit: Payer: Self-pay | Admitting: Obstetrics and Gynecology

## 2020-05-27 ENCOUNTER — Other Ambulatory Visit (HOSPITAL_COMMUNITY): Payer: Self-pay | Admitting: Obstetrics and Gynecology

## 2020-05-27 DIAGNOSIS — E041 Nontoxic single thyroid nodule: Secondary | ICD-10-CM

## 2020-06-07 ENCOUNTER — Other Ambulatory Visit: Payer: Self-pay

## 2020-06-07 ENCOUNTER — Ambulatory Visit (HOSPITAL_COMMUNITY)
Admission: RE | Admit: 2020-06-07 | Discharge: 2020-06-07 | Disposition: A | Discharge status: Home / Self Care | Payer: BLUE CROSS/BLUE SHIELD | Source: Ambulatory Visit | Attending: Obstetrics and Gynecology | Admitting: Obstetrics and Gynecology

## 2020-06-07 DIAGNOSIS — E041 Nontoxic single thyroid nodule: Secondary | ICD-10-CM | POA: Insufficient documentation

## 2020-06-07 DIAGNOSIS — E042 Nontoxic multinodular goiter: Secondary | ICD-10-CM | POA: Diagnosis not present

## 2020-06-10 DIAGNOSIS — H02402 Unspecified ptosis of left eyelid: Secondary | ICD-10-CM | POA: Diagnosis not present

## 2020-06-10 DIAGNOSIS — G950 Syringomyelia and syringobulbia: Secondary | ICD-10-CM | POA: Diagnosis not present

## 2020-06-10 DIAGNOSIS — M25511 Pain in right shoulder: Secondary | ICD-10-CM | POA: Diagnosis not present

## 2020-06-10 DIAGNOSIS — R200 Anesthesia of skin: Secondary | ICD-10-CM | POA: Diagnosis not present

## 2020-06-15 DIAGNOSIS — M7502 Adhesive capsulitis of left shoulder: Secondary | ICD-10-CM | POA: Diagnosis not present

## 2020-06-15 DIAGNOSIS — M7501 Adhesive capsulitis of right shoulder: Secondary | ICD-10-CM | POA: Diagnosis not present

## 2020-06-22 DIAGNOSIS — R200 Anesthesia of skin: Secondary | ICD-10-CM | POA: Diagnosis not present

## 2020-06-24 DIAGNOSIS — S14109A Unspecified injury at unspecified level of cervical spinal cord, initial encounter: Secondary | ICD-10-CM | POA: Diagnosis not present

## 2020-06-24 DIAGNOSIS — G950 Syringomyelia and syringobulbia: Secondary | ICD-10-CM | POA: Diagnosis not present

## 2020-06-24 DIAGNOSIS — M47812 Spondylosis without myelopathy or radiculopathy, cervical region: Secondary | ICD-10-CM | POA: Diagnosis not present

## 2020-06-24 DIAGNOSIS — M4802 Spinal stenosis, cervical region: Secondary | ICD-10-CM | POA: Diagnosis not present

## 2020-06-28 DIAGNOSIS — M25512 Pain in left shoulder: Secondary | ICD-10-CM | POA: Diagnosis not present

## 2020-06-28 DIAGNOSIS — M25511 Pain in right shoulder: Secondary | ICD-10-CM | POA: Diagnosis not present

## 2020-07-05 DIAGNOSIS — M25511 Pain in right shoulder: Secondary | ICD-10-CM | POA: Diagnosis not present

## 2020-07-05 DIAGNOSIS — M25512 Pain in left shoulder: Secondary | ICD-10-CM | POA: Diagnosis not present

## 2020-07-06 DIAGNOSIS — M5030 Other cervical disc degeneration, unspecified cervical region: Secondary | ICD-10-CM | POA: Diagnosis not present

## 2020-07-06 DIAGNOSIS — M7502 Adhesive capsulitis of left shoulder: Secondary | ICD-10-CM | POA: Diagnosis not present

## 2020-07-06 DIAGNOSIS — G5603 Carpal tunnel syndrome, bilateral upper limbs: Secondary | ICD-10-CM | POA: Diagnosis not present

## 2020-07-06 DIAGNOSIS — M7501 Adhesive capsulitis of right shoulder: Secondary | ICD-10-CM | POA: Diagnosis not present

## 2020-07-07 DIAGNOSIS — M25512 Pain in left shoulder: Secondary | ICD-10-CM | POA: Diagnosis not present

## 2020-07-07 DIAGNOSIS — M25511 Pain in right shoulder: Secondary | ICD-10-CM | POA: Diagnosis not present

## 2020-07-14 DIAGNOSIS — M25511 Pain in right shoulder: Secondary | ICD-10-CM | POA: Diagnosis not present

## 2020-07-14 DIAGNOSIS — M25512 Pain in left shoulder: Secondary | ICD-10-CM | POA: Diagnosis not present

## 2020-07-20 DIAGNOSIS — M25511 Pain in right shoulder: Secondary | ICD-10-CM | POA: Diagnosis not present

## 2020-07-20 DIAGNOSIS — M25512 Pain in left shoulder: Secondary | ICD-10-CM | POA: Diagnosis not present

## 2020-07-28 DIAGNOSIS — M25512 Pain in left shoulder: Secondary | ICD-10-CM | POA: Diagnosis not present

## 2020-07-28 DIAGNOSIS — M25511 Pain in right shoulder: Secondary | ICD-10-CM | POA: Diagnosis not present

## 2020-08-01 DIAGNOSIS — M25511 Pain in right shoulder: Secondary | ICD-10-CM | POA: Diagnosis not present

## 2020-08-01 DIAGNOSIS — M25512 Pain in left shoulder: Secondary | ICD-10-CM | POA: Diagnosis not present

## 2020-08-08 DIAGNOSIS — M25511 Pain in right shoulder: Secondary | ICD-10-CM | POA: Diagnosis not present

## 2020-08-08 DIAGNOSIS — M25512 Pain in left shoulder: Secondary | ICD-10-CM | POA: Diagnosis not present

## 2020-08-18 DIAGNOSIS — M25511 Pain in right shoulder: Secondary | ICD-10-CM | POA: Diagnosis not present

## 2020-08-18 DIAGNOSIS — M25512 Pain in left shoulder: Secondary | ICD-10-CM | POA: Diagnosis not present

## 2020-08-23 DIAGNOSIS — M25512 Pain in left shoulder: Secondary | ICD-10-CM | POA: Diagnosis not present

## 2020-08-23 DIAGNOSIS — M25511 Pain in right shoulder: Secondary | ICD-10-CM | POA: Diagnosis not present

## 2020-09-05 DIAGNOSIS — M25512 Pain in left shoulder: Secondary | ICD-10-CM | POA: Diagnosis not present

## 2020-09-05 DIAGNOSIS — M25511 Pain in right shoulder: Secondary | ICD-10-CM | POA: Diagnosis not present

## 2020-09-20 DIAGNOSIS — M25511 Pain in right shoulder: Secondary | ICD-10-CM | POA: Diagnosis not present

## 2020-09-20 DIAGNOSIS — M25512 Pain in left shoulder: Secondary | ICD-10-CM | POA: Diagnosis not present

## 2020-09-27 DIAGNOSIS — M25512 Pain in left shoulder: Secondary | ICD-10-CM | POA: Diagnosis not present

## 2020-09-27 DIAGNOSIS — M25511 Pain in right shoulder: Secondary | ICD-10-CM | POA: Diagnosis not present

## 2020-10-04 DIAGNOSIS — M25512 Pain in left shoulder: Secondary | ICD-10-CM | POA: Diagnosis not present

## 2020-10-04 DIAGNOSIS — M25511 Pain in right shoulder: Secondary | ICD-10-CM | POA: Diagnosis not present

## 2020-10-05 ENCOUNTER — Other Ambulatory Visit: Payer: Self-pay | Admitting: Obstetrics and Gynecology

## 2020-10-05 DIAGNOSIS — Z1231 Encounter for screening mammogram for malignant neoplasm of breast: Secondary | ICD-10-CM

## 2020-10-06 DIAGNOSIS — L578 Other skin changes due to chronic exposure to nonionizing radiation: Secondary | ICD-10-CM | POA: Diagnosis not present

## 2020-10-06 DIAGNOSIS — Z86018 Personal history of other benign neoplasm: Secondary | ICD-10-CM | POA: Diagnosis not present

## 2020-10-06 DIAGNOSIS — L811 Chloasma: Secondary | ICD-10-CM | POA: Diagnosis not present

## 2020-10-06 DIAGNOSIS — L719 Rosacea, unspecified: Secondary | ICD-10-CM | POA: Diagnosis not present

## 2020-10-06 DIAGNOSIS — D2261 Melanocytic nevi of right upper limb, including shoulder: Secondary | ICD-10-CM | POA: Diagnosis not present

## 2020-10-06 DIAGNOSIS — D485 Neoplasm of uncertain behavior of skin: Secondary | ICD-10-CM | POA: Diagnosis not present

## 2020-10-10 DIAGNOSIS — M25512 Pain in left shoulder: Secondary | ICD-10-CM | POA: Diagnosis not present

## 2020-10-10 DIAGNOSIS — G5601 Carpal tunnel syndrome, right upper limb: Secondary | ICD-10-CM | POA: Diagnosis not present

## 2020-10-10 DIAGNOSIS — G5602 Carpal tunnel syndrome, left upper limb: Secondary | ICD-10-CM | POA: Diagnosis not present

## 2020-10-10 DIAGNOSIS — M25511 Pain in right shoulder: Secondary | ICD-10-CM | POA: Diagnosis not present

## 2020-10-11 DIAGNOSIS — E1169 Type 2 diabetes mellitus with other specified complication: Secondary | ICD-10-CM | POA: Diagnosis not present

## 2020-10-11 DIAGNOSIS — K219 Gastro-esophageal reflux disease without esophagitis: Secondary | ICD-10-CM | POA: Diagnosis not present

## 2020-10-11 DIAGNOSIS — Z1159 Encounter for screening for other viral diseases: Secondary | ICD-10-CM | POA: Diagnosis not present

## 2020-10-11 DIAGNOSIS — I10 Essential (primary) hypertension: Secondary | ICD-10-CM | POA: Diagnosis not present

## 2020-10-11 DIAGNOSIS — E7800 Pure hypercholesterolemia, unspecified: Secondary | ICD-10-CM | POA: Diagnosis not present

## 2020-10-20 DIAGNOSIS — M25511 Pain in right shoulder: Secondary | ICD-10-CM | POA: Diagnosis not present

## 2020-10-20 DIAGNOSIS — G5601 Carpal tunnel syndrome, right upper limb: Secondary | ICD-10-CM | POA: Diagnosis not present

## 2020-10-20 DIAGNOSIS — M25512 Pain in left shoulder: Secondary | ICD-10-CM | POA: Diagnosis not present

## 2020-10-20 DIAGNOSIS — G5602 Carpal tunnel syndrome, left upper limb: Secondary | ICD-10-CM | POA: Diagnosis not present

## 2020-10-27 DIAGNOSIS — M25512 Pain in left shoulder: Secondary | ICD-10-CM | POA: Diagnosis not present

## 2020-10-27 DIAGNOSIS — G5601 Carpal tunnel syndrome, right upper limb: Secondary | ICD-10-CM | POA: Diagnosis not present

## 2020-10-27 DIAGNOSIS — M25511 Pain in right shoulder: Secondary | ICD-10-CM | POA: Diagnosis not present

## 2020-10-27 DIAGNOSIS — G5602 Carpal tunnel syndrome, left upper limb: Secondary | ICD-10-CM | POA: Diagnosis not present

## 2020-11-01 DIAGNOSIS — M25511 Pain in right shoulder: Secondary | ICD-10-CM | POA: Diagnosis not present

## 2020-11-01 DIAGNOSIS — G5601 Carpal tunnel syndrome, right upper limb: Secondary | ICD-10-CM | POA: Diagnosis not present

## 2020-11-01 DIAGNOSIS — G5602 Carpal tunnel syndrome, left upper limb: Secondary | ICD-10-CM | POA: Diagnosis not present

## 2020-11-01 DIAGNOSIS — M25512 Pain in left shoulder: Secondary | ICD-10-CM | POA: Diagnosis not present

## 2020-11-02 DIAGNOSIS — E559 Vitamin D deficiency, unspecified: Secondary | ICD-10-CM | POA: Diagnosis not present

## 2020-11-02 DIAGNOSIS — I10 Essential (primary) hypertension: Secondary | ICD-10-CM | POA: Diagnosis not present

## 2020-11-02 DIAGNOSIS — E7800 Pure hypercholesterolemia, unspecified: Secondary | ICD-10-CM | POA: Diagnosis not present

## 2020-11-02 DIAGNOSIS — E1169 Type 2 diabetes mellitus with other specified complication: Secondary | ICD-10-CM | POA: Diagnosis not present

## 2020-11-02 DIAGNOSIS — Z0000 Encounter for general adult medical examination without abnormal findings: Secondary | ICD-10-CM | POA: Diagnosis not present

## 2020-11-15 DIAGNOSIS — M25511 Pain in right shoulder: Secondary | ICD-10-CM | POA: Diagnosis not present

## 2020-11-15 DIAGNOSIS — G5601 Carpal tunnel syndrome, right upper limb: Secondary | ICD-10-CM | POA: Diagnosis not present

## 2020-11-15 DIAGNOSIS — M25512 Pain in left shoulder: Secondary | ICD-10-CM | POA: Diagnosis not present

## 2020-11-15 DIAGNOSIS — G5602 Carpal tunnel syndrome, left upper limb: Secondary | ICD-10-CM | POA: Diagnosis not present

## 2020-11-17 ENCOUNTER — Other Ambulatory Visit: Payer: Self-pay

## 2020-11-17 ENCOUNTER — Ambulatory Visit
Admission: RE | Admit: 2020-11-17 | Discharge: 2020-11-17 | Disposition: A | Discharge status: Home / Self Care | Payer: BLUE CROSS/BLUE SHIELD | Source: Ambulatory Visit | Attending: Obstetrics and Gynecology | Admitting: Obstetrics and Gynecology

## 2020-11-17 DIAGNOSIS — Z1231 Encounter for screening mammogram for malignant neoplasm of breast: Secondary | ICD-10-CM

## 2020-11-24 DIAGNOSIS — G5602 Carpal tunnel syndrome, left upper limb: Secondary | ICD-10-CM | POA: Diagnosis not present

## 2020-11-24 DIAGNOSIS — M25512 Pain in left shoulder: Secondary | ICD-10-CM | POA: Diagnosis not present

## 2020-11-24 DIAGNOSIS — G5601 Carpal tunnel syndrome, right upper limb: Secondary | ICD-10-CM | POA: Diagnosis not present

## 2020-11-24 DIAGNOSIS — M25511 Pain in right shoulder: Secondary | ICD-10-CM | POA: Diagnosis not present

## 2020-11-25 DIAGNOSIS — Z01812 Encounter for preprocedural laboratory examination: Secondary | ICD-10-CM | POA: Diagnosis not present

## 2020-11-30 DIAGNOSIS — Z1509 Genetic susceptibility to other malignant neoplasm: Secondary | ICD-10-CM | POA: Diagnosis not present

## 2020-11-30 DIAGNOSIS — Z86010 Personal history of colonic polyps: Secondary | ICD-10-CM | POA: Diagnosis not present

## 2020-11-30 DIAGNOSIS — K648 Other hemorrhoids: Secondary | ICD-10-CM | POA: Diagnosis not present

## 2020-12-14 DIAGNOSIS — D485 Neoplasm of uncertain behavior of skin: Secondary | ICD-10-CM | POA: Diagnosis not present

## 2020-12-14 DIAGNOSIS — L988 Other specified disorders of the skin and subcutaneous tissue: Secondary | ICD-10-CM | POA: Diagnosis not present

## 2021-01-04 DIAGNOSIS — L719 Rosacea, unspecified: Secondary | ICD-10-CM | POA: Diagnosis not present

## 2021-01-21 IMAGING — MR MR BILATERAL BREAST WITHOUT AND WITH CONTRAST
8 of 12 series · 30 of 48 positions shown · IV contrast (gadavist)
Comparison: Supplemental screening Breast MRI 07/22/2017,
07/18/2016. Prior mammograms, most recently 07/14/2018.
COMPARISON: Supplemental screening Breast MRI 07/22/2017,
07/18/2016. Prior mammograms, most recently 07/14/2018.

Addendum:
CLINICAL DATA: 48-year-old with a strong family history of breast
cancer and an elevated estimated lifetime risk of breast cancer of
greater than 20% (approximately 23.7%). Supplemental screening MRI
is performed.

LABS:  Creatinine was obtained on site at [HOSPITAL] at [REDACTED] [HOSPITAL].
Results: Creatinine 0.9 mg/dL, estimated GFR 76.
EXAM:
BILATERAL BREAST MRI WITH AND WITHOUT CONTRAST
TECHNIQUE: Multiplanar, multisequence MR images of both breasts were obtained
prior to and following the intravenous administration of 10 ml of
Gadavist.

[Series 2: t2_tirm_tra ipat (a-p) · axial · 3.0mm · 0.78mm/px · 1 of 60 slices shown]
[im 1/60]
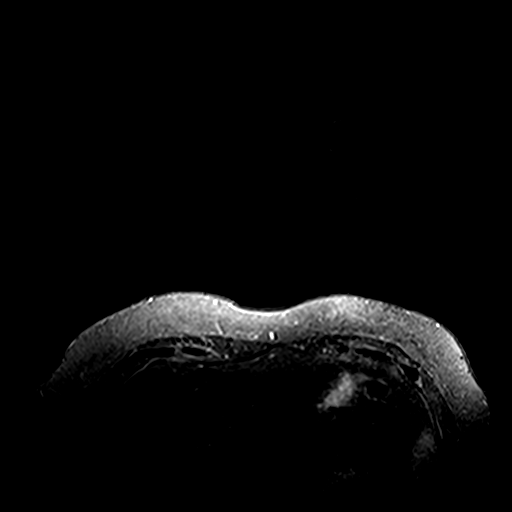

[Series 3: fl3d pre-cm no · axial · non-contrast · 1.2mm · 1.04mm/px · z∈[-87,+104]mm · 5 of 160 slices shown]
[im 1/160]
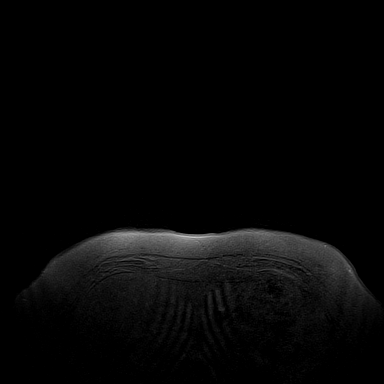
[im 40/160]
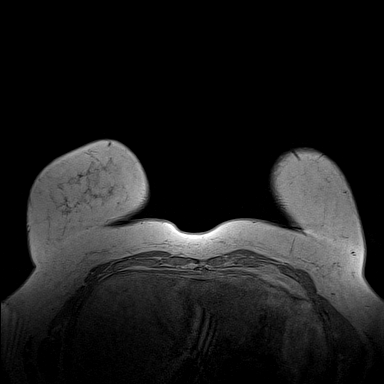
[im 80/160]
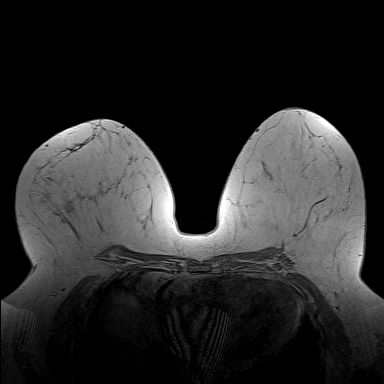
[im 120/160]
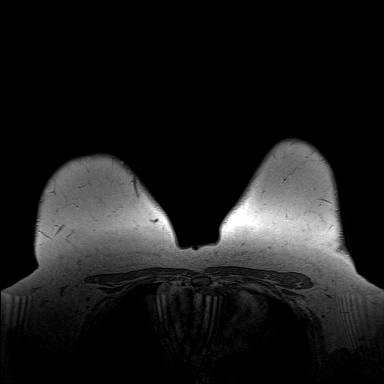
[im 160/160]
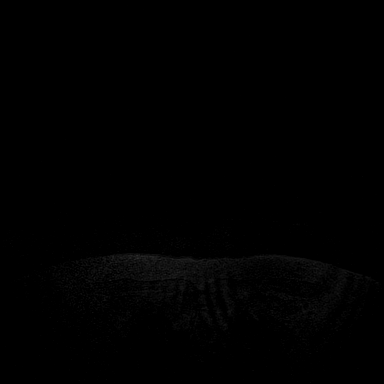

[Series 4: fl3d pre-cm · axial · non-contrast · 1.2mm · 1.04mm/px · z∈[-87,+104]mm · 5 of 160 slices shown]
[im 1/160]
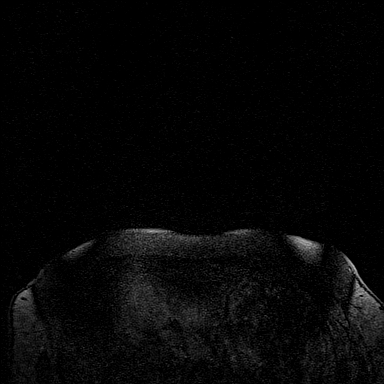
[im 40/160]
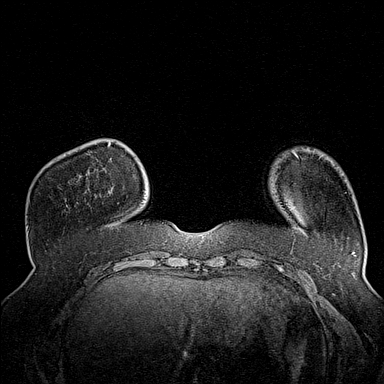
[im 80/160]
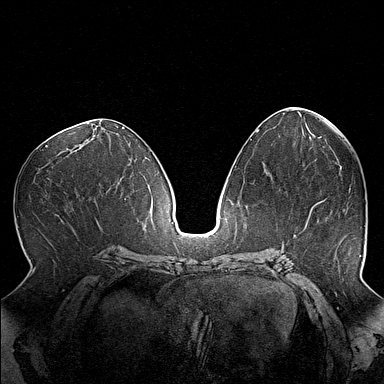
[im 120/160]
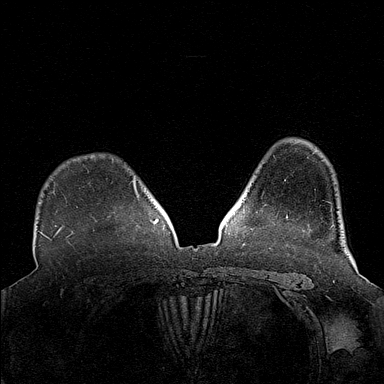
[im 160/160]
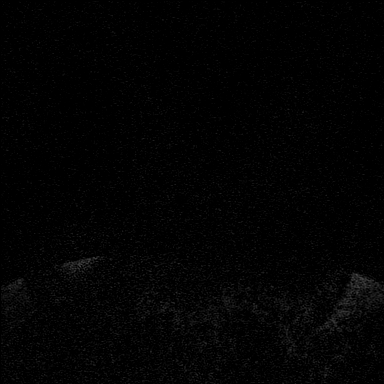

[Series 5: fl3d post-cm 20 · axial · 1.2mm · 1.04mm/px · z∈[-87,+104]mm · 5 of 160 slices shown (1 of 3)]
[im 1/160]
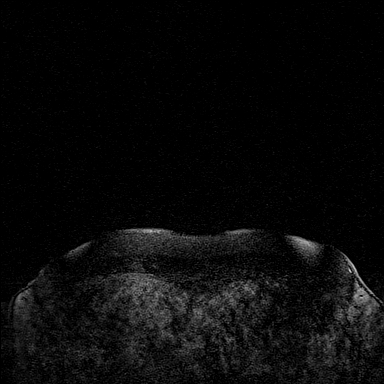
[im 40/160]
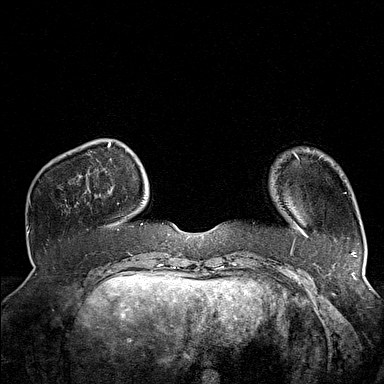
[im 80/160]
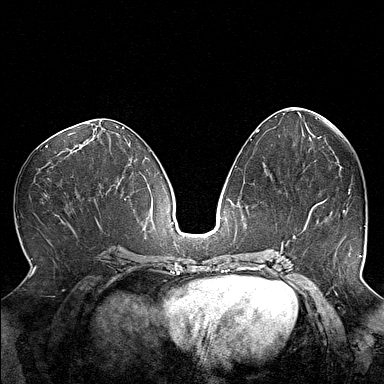
[im 120/160]
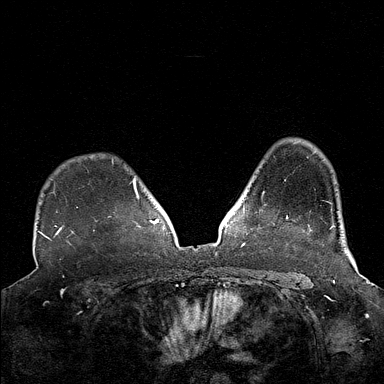
[im 160/160]
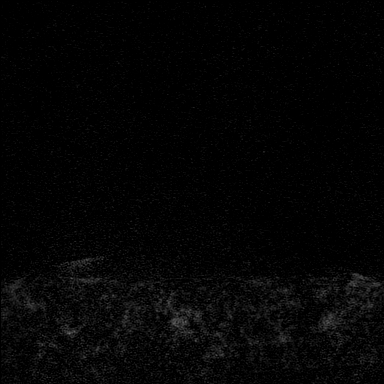

[Series 6: fl3d post-cm 20 · axial · 1.2mm · 1.04mm/px · z∈[-87,+104]mm · 5 of 160 slices shown (2 of 3)]
[im 1/160]
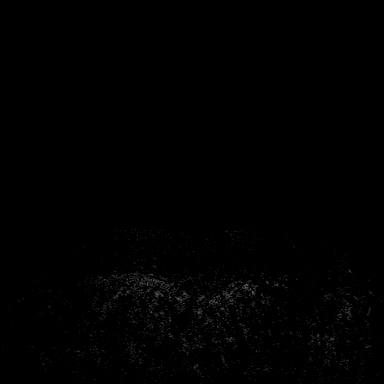
[im 40/160]
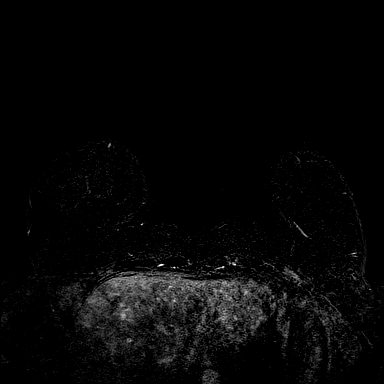
[im 80/160]
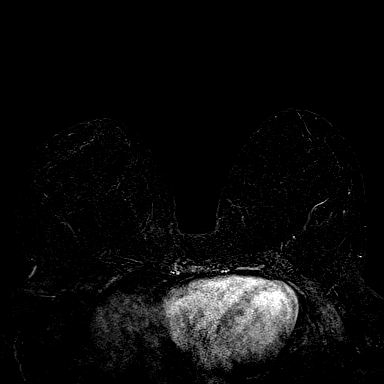
[im 120/160]
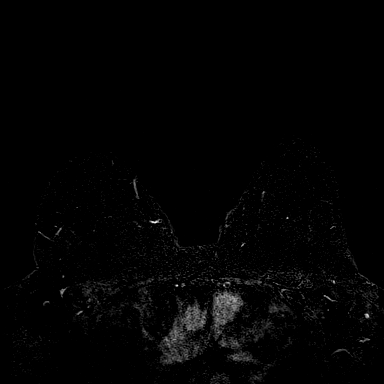
[im 160/160]
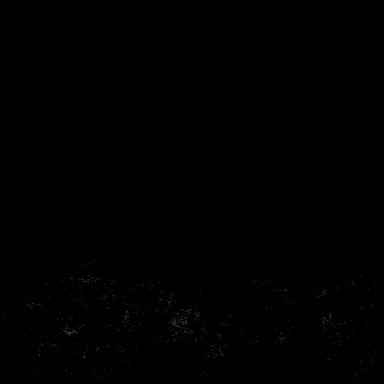

[Series 7: fl3d post-cm 20 · axial · 192.0mm · 1.04mm/px · 1 of 1 slices shown (3 of 3)]
[im 1/1]
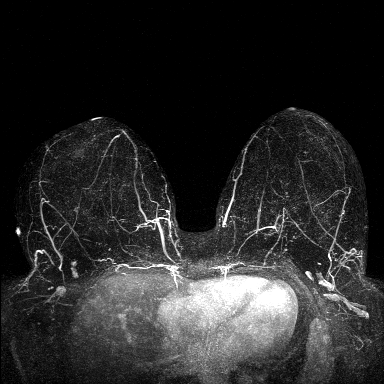

[Series 8: fl3d post-cm 3min · axial · 1.2mm · 1.04mm/px · z∈[-87,+104]mm · 6 of 160 slices shown]
[im 1/160]
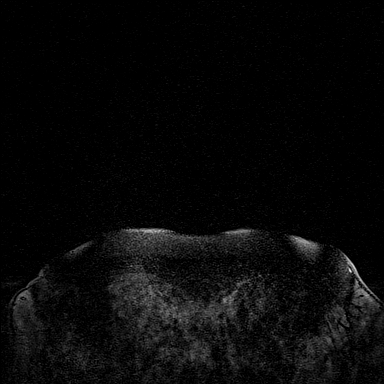
[im 32/160]
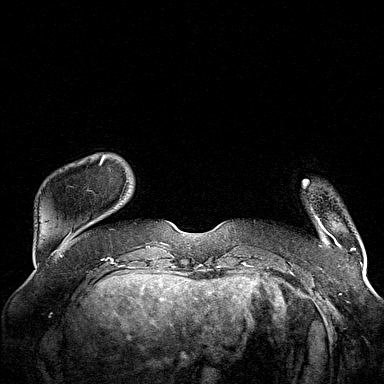
[im 64/160]
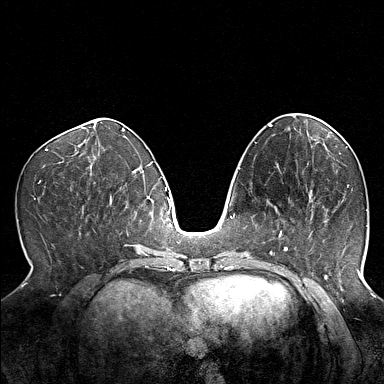
[im 96/160]
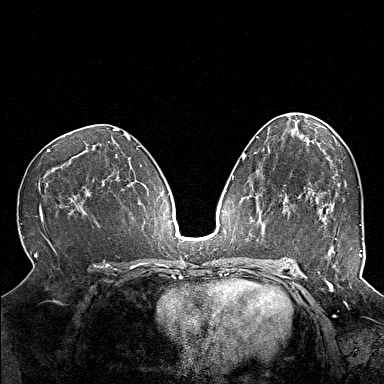
[im 128/160]
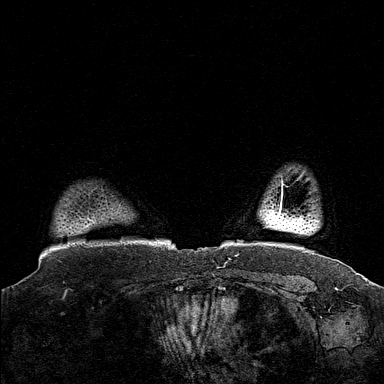
[im 160/160]
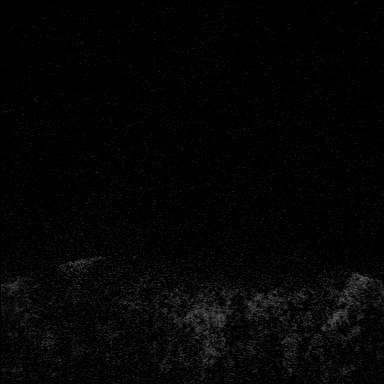

[Series 9: fl3d post-cm 3min_sub · axial · 1.2mm · 1.04mm/px · z∈[-87,-50]mm · 2 of 160 slices shown]
[im 1/160]
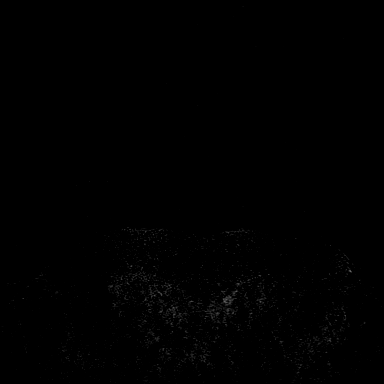
[im 32/160]
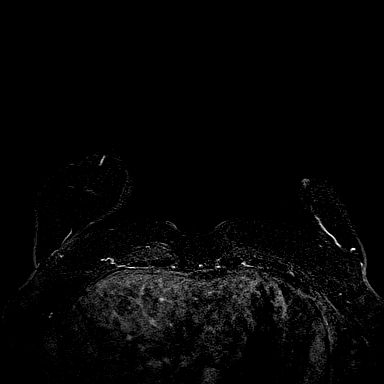

[30 of 48 positions shown; findings below may reference images not displayed]

Three-dimensional MR images were rendered by post-processing of the
original MR data on an independent workstation. The
three-dimensional MR images were interpreted, and findings are
reported in the following complete MRI report for this study. Three
dimensional images were evaluated using the DynaCad thin client on
the interpreting workstation.
FINDINGS: Breast composition: b. Scattered fibroglandular tissue.

Background parenchymal enhancement: Mild.

Right breast: No mass or abnormal enhancement. Stable normal
appearing intramammary lymph node involving the LOWER OUTER QUADRANT
at MIDDLE depth.

Left breast: No mass or abnormal enhancement. Stable normal
appearing intramammary lymph node in the slight LOWER INNER QUADRANT
at MIDDLE depth.

Lymph nodes: No pathologic lymphadenopathy.

Ancillary findings:  None.
IMPRESSION: 1. No MRI evidence of malignancy involving either breast.
2. No pathologic lymphadenopathy.

RECOMMENDATION:
1. Annual BILATERAL screening MRI due in July 2019.
2. Supplemental screening MRI in 1 year based on the patient's
estimated lifetime risk of greater than 20%. This recommendation
follows [employer] guidelines.

BI-RADS CATEGORY  1: Negative.

ADDENDUM:
# 1 in the Recommendation section should read: Annual BILATERAL
screening mammography due in July 2019.

*** End of Addendum ***
Three-dimensional MR images were rendered by post-processing of the
original MR data on an independent workstation. The
three-dimensional MR images were interpreted, and findings are
reported in the following complete MRI report for this study. Three
dimensional images were evaluated using the DynaCad thin client on
the interpreting workstation.
FINDINGS: Breast composition: b. Scattered fibroglandular tissue.

Background parenchymal enhancement: Mild.

Right breast: No mass or abnormal enhancement. Stable normal
appearing intramammary lymph node involving the LOWER OUTER QUADRANT
at MIDDLE depth.

Left breast: No mass or abnormal enhancement. Stable normal
appearing intramammary lymph node in the slight LOWER INNER QUADRANT
at MIDDLE depth.

Lymph nodes: No pathologic lymphadenopathy.

Ancillary findings:  None.
IMPRESSION: 1. No MRI evidence of malignancy involving either breast.
2. No pathologic lymphadenopathy.

RECOMMENDATION:
1. Annual BILATERAL screening MRI due in July 2019.
2. Supplemental screening MRI in 1 year based on the patient's
estimated lifetime risk of greater than 20%. This recommendation
follows [employer] guidelines.

BI-RADS CATEGORY  1: Negative.

## 2021-04-12 DIAGNOSIS — E7800 Pure hypercholesterolemia, unspecified: Secondary | ICD-10-CM | POA: Diagnosis not present

## 2021-04-12 DIAGNOSIS — E1169 Type 2 diabetes mellitus with other specified complication: Secondary | ICD-10-CM | POA: Diagnosis not present

## 2021-04-12 DIAGNOSIS — I10 Essential (primary) hypertension: Secondary | ICD-10-CM | POA: Diagnosis not present

## 2021-04-12 DIAGNOSIS — K219 Gastro-esophageal reflux disease without esophagitis: Secondary | ICD-10-CM | POA: Diagnosis not present

## 2021-05-09 DIAGNOSIS — G4733 Obstructive sleep apnea (adult) (pediatric): Secondary | ICD-10-CM | POA: Diagnosis not present

## 2021-05-29 ENCOUNTER — Other Ambulatory Visit: Payer: Self-pay | Admitting: Obstetrics and Gynecology

## 2021-05-29 DIAGNOSIS — Z803 Family history of malignant neoplasm of breast: Secondary | ICD-10-CM

## 2021-05-29 DIAGNOSIS — R5382 Chronic fatigue, unspecified: Secondary | ICD-10-CM | POA: Diagnosis not present

## 2021-05-29 DIAGNOSIS — H02402 Unspecified ptosis of left eyelid: Secondary | ICD-10-CM | POA: Diagnosis not present

## 2021-05-29 DIAGNOSIS — Z01419 Encounter for gynecological examination (general) (routine) without abnormal findings: Secondary | ICD-10-CM | POA: Diagnosis not present

## 2021-05-29 DIAGNOSIS — Z6841 Body Mass Index (BMI) 40.0 and over, adult: Secondary | ICD-10-CM | POA: Diagnosis not present

## 2021-06-12 DIAGNOSIS — R87810 Cervical high risk human papillomavirus (HPV) DNA test positive: Secondary | ICD-10-CM | POA: Diagnosis not present

## 2021-06-12 DIAGNOSIS — N890 Mild vaginal dysplasia: Secondary | ICD-10-CM | POA: Diagnosis not present

## 2021-06-16 ENCOUNTER — Ambulatory Visit
Admission: RE | Admit: 2021-06-16 | Discharge: 2021-06-16 | Disposition: A | Discharge status: Home / Self Care | Payer: BLUE CROSS/BLUE SHIELD | Source: Ambulatory Visit | Attending: Obstetrics and Gynecology | Admitting: Obstetrics and Gynecology

## 2021-06-16 ENCOUNTER — Other Ambulatory Visit: Payer: Self-pay

## 2021-06-16 DIAGNOSIS — Z803 Family history of malignant neoplasm of breast: Secondary | ICD-10-CM

## 2021-06-16 DIAGNOSIS — N6489 Other specified disorders of breast: Secondary | ICD-10-CM | POA: Diagnosis not present

## 2021-06-16 MED ORDER — GADOBUTROL 1 MMOL/ML IV SOLN
10.0000 mL | Freq: Once | INTRAVENOUS | Status: AC | PRN
  Administered 2021-06-16: 10 mL via INTRAVENOUS

## 2021-07-12 DIAGNOSIS — E559 Vitamin D deficiency, unspecified: Secondary | ICD-10-CM | POA: Diagnosis not present

## 2021-07-12 DIAGNOSIS — Z13820 Encounter for screening for osteoporosis: Secondary | ICD-10-CM | POA: Diagnosis not present

## 2021-07-12 DIAGNOSIS — N959 Unspecified menopausal and perimenopausal disorder: Secondary | ICD-10-CM | POA: Diagnosis not present

## 2021-07-19 DIAGNOSIS — E119 Type 2 diabetes mellitus without complications: Secondary | ICD-10-CM | POA: Diagnosis not present

## 2021-09-20 DIAGNOSIS — U071 COVID-19: Secondary | ICD-10-CM | POA: Diagnosis not present

## 2021-09-20 DIAGNOSIS — J4521 Mild intermittent asthma with (acute) exacerbation: Secondary | ICD-10-CM | POA: Diagnosis not present

## 2021-09-20 DIAGNOSIS — R051 Acute cough: Secondary | ICD-10-CM | POA: Diagnosis not present

## 2021-09-20 DIAGNOSIS — R509 Fever, unspecified: Secondary | ICD-10-CM | POA: Diagnosis not present

## 2021-10-11 DIAGNOSIS — L578 Other skin changes due to chronic exposure to nonionizing radiation: Secondary | ICD-10-CM | POA: Diagnosis not present

## 2021-10-11 DIAGNOSIS — L719 Rosacea, unspecified: Secondary | ICD-10-CM | POA: Diagnosis not present

## 2021-10-11 DIAGNOSIS — D2222 Melanocytic nevi of left ear and external auricular canal: Secondary | ICD-10-CM | POA: Diagnosis not present

## 2021-10-11 DIAGNOSIS — L821 Other seborrheic keratosis: Secondary | ICD-10-CM | POA: Diagnosis not present

## 2021-10-30 DIAGNOSIS — E1169 Type 2 diabetes mellitus with other specified complication: Secondary | ICD-10-CM | POA: Diagnosis not present

## 2021-10-30 DIAGNOSIS — Z23 Encounter for immunization: Secondary | ICD-10-CM | POA: Diagnosis not present

## 2021-10-30 DIAGNOSIS — I10 Essential (primary) hypertension: Secondary | ICD-10-CM | POA: Diagnosis not present

## 2021-10-30 DIAGNOSIS — E7800 Pure hypercholesterolemia, unspecified: Secondary | ICD-10-CM | POA: Diagnosis not present

## 2021-10-30 DIAGNOSIS — Z0000 Encounter for general adult medical examination without abnormal findings: Secondary | ICD-10-CM | POA: Diagnosis not present

## 2021-10-30 DIAGNOSIS — K219 Gastro-esophageal reflux disease without esophagitis: Secondary | ICD-10-CM | POA: Diagnosis not present

## 2021-12-05 DIAGNOSIS — Z1151 Encounter for screening for human papillomavirus (HPV): Secondary | ICD-10-CM | POA: Diagnosis not present

## 2021-12-05 DIAGNOSIS — Z1272 Encounter for screening for malignant neoplasm of vagina: Secondary | ICD-10-CM | POA: Diagnosis not present

## 2021-12-05 DIAGNOSIS — R87619 Unspecified abnormal cytological findings in specimens from cervix uteri: Secondary | ICD-10-CM | POA: Diagnosis not present

## 2021-12-14 DIAGNOSIS — E559 Vitamin D deficiency, unspecified: Secondary | ICD-10-CM | POA: Diagnosis not present

## 2021-12-14 DIAGNOSIS — H02402 Unspecified ptosis of left eyelid: Secondary | ICD-10-CM | POA: Diagnosis not present

## 2021-12-29 ENCOUNTER — Other Ambulatory Visit: Payer: Self-pay | Admitting: Obstetrics and Gynecology

## 2021-12-29 DIAGNOSIS — Z1231 Encounter for screening mammogram for malignant neoplasm of breast: Secondary | ICD-10-CM

## 2022-01-08 ENCOUNTER — Ambulatory Visit
Admission: RE | Admit: 2022-01-08 | Discharge: 2022-01-08 | Disposition: A | Discharge status: Home / Self Care | Payer: BLUE CROSS/BLUE SHIELD | Source: Ambulatory Visit | Attending: Obstetrics and Gynecology | Admitting: Obstetrics and Gynecology

## 2022-01-08 DIAGNOSIS — Z1231 Encounter for screening mammogram for malignant neoplasm of breast: Secondary | ICD-10-CM

## 2022-04-25 DIAGNOSIS — H02402 Unspecified ptosis of left eyelid: Secondary | ICD-10-CM | POA: Diagnosis not present

## 2022-05-22 DIAGNOSIS — E7800 Pure hypercholesterolemia, unspecified: Secondary | ICD-10-CM | POA: Diagnosis not present

## 2022-05-22 DIAGNOSIS — I10 Essential (primary) hypertension: Secondary | ICD-10-CM | POA: Diagnosis not present

## 2022-05-22 DIAGNOSIS — K219 Gastro-esophageal reflux disease without esophagitis: Secondary | ICD-10-CM | POA: Diagnosis not present

## 2022-05-22 DIAGNOSIS — Z23 Encounter for immunization: Secondary | ICD-10-CM | POA: Diagnosis not present

## 2022-05-22 DIAGNOSIS — E1169 Type 2 diabetes mellitus with other specified complication: Secondary | ICD-10-CM | POA: Diagnosis not present

## 2022-05-22 DIAGNOSIS — D509 Iron deficiency anemia, unspecified: Secondary | ICD-10-CM | POA: Diagnosis not present

## 2022-05-22 DIAGNOSIS — E559 Vitamin D deficiency, unspecified: Secondary | ICD-10-CM | POA: Diagnosis not present

## 2022-05-30 DIAGNOSIS — Z01419 Encounter for gynecological examination (general) (routine) without abnormal findings: Secondary | ICD-10-CM | POA: Diagnosis not present

## 2022-05-30 DIAGNOSIS — Z6841 Body Mass Index (BMI) 40.0 and over, adult: Secondary | ICD-10-CM | POA: Diagnosis not present

## 2022-05-30 DIAGNOSIS — Z1272 Encounter for screening for malignant neoplasm of vagina: Secondary | ICD-10-CM | POA: Diagnosis not present

## 2022-05-30 DIAGNOSIS — Z1151 Encounter for screening for human papillomavirus (HPV): Secondary | ICD-10-CM | POA: Diagnosis not present

## 2022-06-01 ENCOUNTER — Other Ambulatory Visit (HOSPITAL_COMMUNITY): Payer: Self-pay | Admitting: Obstetrics and Gynecology

## 2022-06-01 ENCOUNTER — Other Ambulatory Visit: Payer: Self-pay | Admitting: Obstetrics and Gynecology

## 2022-06-01 DIAGNOSIS — Z803 Family history of malignant neoplasm of breast: Secondary | ICD-10-CM

## 2022-06-01 DIAGNOSIS — E041 Nontoxic single thyroid nodule: Secondary | ICD-10-CM

## 2022-06-04 ENCOUNTER — Other Ambulatory Visit: Payer: Self-pay | Admitting: Obstetrics and Gynecology

## 2022-06-04 DIAGNOSIS — Z1509 Genetic susceptibility to other malignant neoplasm: Secondary | ICD-10-CM

## 2022-06-08 ENCOUNTER — Ambulatory Visit (HOSPITAL_COMMUNITY)
Admission: RE | Admit: 2022-06-08 | Discharge: 2022-06-08 | Disposition: A | Discharge status: Home / Self Care | Payer: BLUE CROSS/BLUE SHIELD | Source: Ambulatory Visit | Attending: Obstetrics and Gynecology | Admitting: Obstetrics and Gynecology

## 2022-06-08 DIAGNOSIS — E041 Nontoxic single thyroid nodule: Secondary | ICD-10-CM | POA: Insufficient documentation

## 2022-06-17 ENCOUNTER — Ambulatory Visit
Admission: RE | Admit: 2022-06-17 | Discharge: 2022-06-17 | Disposition: A | Discharge status: Home / Self Care | Payer: BLUE CROSS/BLUE SHIELD | Source: Ambulatory Visit | Attending: Obstetrics and Gynecology | Admitting: Obstetrics and Gynecology

## 2022-06-17 DIAGNOSIS — Z803 Family history of malignant neoplasm of breast: Secondary | ICD-10-CM

## 2022-06-17 MED ORDER — GADOBUTROL 1 MMOL/ML IV SOLN
10.0000 mL | Freq: Once | INTRAVENOUS | Status: AC | PRN
  Administered 2022-06-17: 10 mL via INTRAVENOUS

## 2022-06-23 ENCOUNTER — Ambulatory Visit
Admission: RE | Admit: 2022-06-23 | Discharge: 2022-06-23 | Disposition: A | Discharge status: Home / Self Care | Payer: BLUE CROSS/BLUE SHIELD | Source: Ambulatory Visit | Attending: Obstetrics and Gynecology | Admitting: Obstetrics and Gynecology

## 2022-06-23 DIAGNOSIS — Z1509 Genetic susceptibility to other malignant neoplasm: Secondary | ICD-10-CM

## 2022-06-23 MED ORDER — GADOPICLENOL 0.5 MMOL/ML IV SOLN
10.0000 mL | Freq: Once | INTRAVENOUS | Status: AC | PRN
  Administered 2022-06-23: 10 mL via INTRAVENOUS

## 2022-08-13 DIAGNOSIS — J0100 Acute maxillary sinusitis, unspecified: Secondary | ICD-10-CM | POA: Diagnosis not present

## 2023-06-05 ENCOUNTER — Other Ambulatory Visit: Payer: Self-pay | Admitting: Obstetrics and Gynecology

## 2023-06-05 DIAGNOSIS — Z8481 Family history of carrier of genetic disease: Secondary | ICD-10-CM

## 2023-06-12 ENCOUNTER — Other Ambulatory Visit: Payer: Self-pay | Admitting: Obstetrics and Gynecology

## 2023-06-12 DIAGNOSIS — Z1231 Encounter for screening mammogram for malignant neoplasm of breast: Secondary | ICD-10-CM

## 2023-06-13 ENCOUNTER — Other Ambulatory Visit (HOSPITAL_BASED_OUTPATIENT_CLINIC_OR_DEPARTMENT_OTHER): Payer: Self-pay

## 2023-06-13 MED ORDER — FLULAVAL 0.5 ML IM SUSY
0.5000 mL | PREFILLED_SYRINGE | Freq: Once | INTRAMUSCULAR | 0 refills | Status: AC
  Filled 2023-06-13: qty 0.5, 1d supply, fill #0

## 2023-06-13 MED ORDER — COMIRNATY 30 MCG/0.3ML IM SUSY
0.3000 mL | PREFILLED_SYRINGE | Freq: Once | INTRAMUSCULAR | 0 refills | Status: AC
  Filled 2023-06-13: qty 0.3, 1d supply, fill #0

## 2023-06-22 ENCOUNTER — Ambulatory Visit
Admission: RE | Admit: 2023-06-22 | Discharge: 2023-06-22 | Disposition: A | Discharge status: Home / Self Care | Payer: 59 | Source: Ambulatory Visit | Attending: Obstetrics and Gynecology | Admitting: Obstetrics and Gynecology

## 2023-06-22 DIAGNOSIS — Z1231 Encounter for screening mammogram for malignant neoplasm of breast: Secondary | ICD-10-CM

## 2023-07-13 ENCOUNTER — Other Ambulatory Visit: Payer: BLUE CROSS/BLUE SHIELD

## 2023-08-31 ENCOUNTER — Inpatient Hospital Stay
Admission: RE | Admit: 2023-08-31 | Discharge: 2023-08-31 | Disposition: A | Discharge status: Home / Self Care | Payer: 59 | Source: Ambulatory Visit | Attending: Obstetrics and Gynecology | Admitting: Obstetrics and Gynecology

## 2023-08-31 DIAGNOSIS — Z8481 Family history of carrier of genetic disease: Secondary | ICD-10-CM

## 2023-08-31 MED ORDER — GADOPICLENOL 0.5 MMOL/ML IV SOLN
10.0000 mL | Freq: Once | INTRAVENOUS | Status: AC | PRN
  Administered 2023-08-31: 10 mL via INTRAVENOUS

## 2024-05-08 ENCOUNTER — Other Ambulatory Visit: Payer: Self-pay | Admitting: Obstetrics and Gynecology

## 2024-05-08 DIAGNOSIS — Z1231 Encounter for screening mammogram for malignant neoplasm of breast: Secondary | ICD-10-CM

## 2024-06-22 ENCOUNTER — Other Ambulatory Visit: Payer: Self-pay | Admitting: Obstetrics and Gynecology

## 2024-06-22 DIAGNOSIS — Z803 Family history of malignant neoplasm of breast: Secondary | ICD-10-CM

## 2024-06-22 DIAGNOSIS — E041 Nontoxic single thyroid nodule: Secondary | ICD-10-CM

## 2024-06-22 DIAGNOSIS — Z800 Family history of malignant neoplasm of digestive organs: Secondary | ICD-10-CM

## 2024-06-23 ENCOUNTER — Ambulatory Visit
Admission: RE | Admit: 2024-06-23 | Discharge: 2024-06-23 | Disposition: A | Source: Ambulatory Visit | Attending: Obstetrics and Gynecology | Admitting: Obstetrics and Gynecology

## 2024-06-23 DIAGNOSIS — Z1231 Encounter for screening mammogram for malignant neoplasm of breast: Secondary | ICD-10-CM

## 2024-06-25 ENCOUNTER — Other Ambulatory Visit

## 2024-07-02 ENCOUNTER — Ambulatory Visit
Admission: RE | Admit: 2024-07-02 | Discharge: 2024-07-02 | Disposition: A | Discharge status: Home / Self Care | Source: Ambulatory Visit | Attending: Obstetrics and Gynecology | Admitting: Obstetrics and Gynecology

## 2024-07-02 DIAGNOSIS — E041 Nontoxic single thyroid nodule: Secondary | ICD-10-CM

## 2024-07-30 ENCOUNTER — Ambulatory Visit
Admission: RE | Admit: 2024-07-30 | Discharge: 2024-07-30 | Disposition: A | Discharge status: Home / Self Care | Source: Ambulatory Visit | Attending: Obstetrics and Gynecology | Admitting: Obstetrics and Gynecology

## 2024-07-30 DIAGNOSIS — Z800 Family history of malignant neoplasm of digestive organs: Secondary | ICD-10-CM

## 2024-07-30 MED ORDER — GADOPICLENOL 0.5 MMOL/ML IV SOLN
10.0000 mL | Freq: Once | INTRAVENOUS | Status: AC | PRN
  Administered 2024-07-30: 10 mL via INTRAVENOUS

## 2024-12-24 ENCOUNTER — Other Ambulatory Visit
